# Patient Record
Sex: Female | Born: 1995 | Race: White | Hispanic: No | Marital: Single | Smoking: Former smoker
Health system: Southern US, Community
[De-identification: ages and names within clinical notes are randomized; demographics above are authoritative.]

## PROBLEM LIST (undated history)

## (undated) ENCOUNTER — Inpatient Hospital Stay: Payer: Self-pay

## (undated) DIAGNOSIS — Z789 Other specified health status: Secondary | ICD-10-CM

## (undated) HISTORY — PX: TONSILLECTOMY: SUR1361

---
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
## 2016-07-24 NOTE — L&D Delivery Note (Signed)
Delivery Note At 4:57 AM a viable female was delivered via Vaginal, Spontaneous Delivery (Presentation: LOA).  APGAR: 8, 9; weight pending .   Placenta status: delivered spontaneously, intact.  Cord: 3VC  with the following complications: none.  Cord pH: none  Anesthesia:  epidural Episiotomy: None Lacerations: 2nd degree;Vaginal;Perineal Suture Repair: 3.0 Est. Blood Loss (mL): 300  Mom to postpartum.  Baby to Couplet care / Skin to Skin.  Called to see patient.  Mom pushed to deliver a viable female infant.  The head followed by shoulders, which delivered without difficulty, and the rest of the body.  No nuchal cord noted.  Baby to mom's chest.  Cord clamped and cut after > 1 min delay.  Cord blood obtained.  Placenta delivered spontaneously, intact, with a 3-vessel cord.  Second degree perineal laceration repaired with 3-0 Vicryl in standard fashion.  All counts correct.  Hemostasis obtained with IV pitocin and fundal massage. EBL 300 mL.     Conard NovakJackson, Jamyia Fortune D, MD 08/12/2016, 5:21 AM

## 2016-07-26 ENCOUNTER — Inpatient Hospital Stay
Admission: EM | Admit: 2016-07-26 | Discharge: 2016-07-26 | Disposition: A | Discharge status: Home / Self Care | Payer: Medicaid Other | Attending: Obstetrics & Gynecology | Admitting: Obstetrics & Gynecology

## 2016-07-26 DIAGNOSIS — O471 False labor at or after 37 completed weeks of gestation: Secondary | ICD-10-CM | POA: Diagnosis not present

## 2016-07-26 DIAGNOSIS — O212 Late vomiting of pregnancy: Secondary | ICD-10-CM | POA: Diagnosis not present

## 2016-07-26 DIAGNOSIS — Z3A37 37 weeks gestation of pregnancy: Secondary | ICD-10-CM | POA: Diagnosis not present

## 2016-07-26 DIAGNOSIS — Z87891 Personal history of nicotine dependence: Secondary | ICD-10-CM | POA: Diagnosis not present

## 2016-07-26 NOTE — Discharge Summary (Signed)
Physician Final Progress Note  Patient ID: Kathleen Mccormick MRN: [id number] DOB/AGE: 12-24-1995 21 y.o.  Admit date: 07/26/2016 Admitting provider: Tresea MallJane Robbie Mccormick, CNM Discharge date: 07/26/2016   Admission Diagnoses: G1P0 at 25103w1d with c/o irregular contractions, vomiting x 2 at midnight and decreased fetal movement.  Discharge Diagnoses:  Active Problems: IUP with reactive NST, not in labor   History reviewed. No pertinent past medical history.  History reviewed. No pertinent surgical history.  No current facility-administered medications on file prior to encounter.    Current Outpatient Prescriptions on File Prior to Encounter  Medication Sig Dispense Refill  . ondansetron (ZOFRAN-ODT) 4 MG disintegrating tablet Take 1 tablet (4 mg total) by mouth once as needed for nausea. (Patient not taking: Reported on 07/26/2016) 20 tablet 0    No Known Allergies  Social History   Social History  . Marital status: Single    Spouse name: N/A  . Number of children: N/A  . Years of education: N/A   Occupational History  . Not on file.   Social History Main Topics  . Smoking status: Former Smoker    Packs/day: 0.50    Types: Cigarettes    Quit date: 11/02/2015  . Smokeless tobacco: Never Used  . Alcohol use No  . Drug use: No  . Sexual activity: Yes   Other Topics Concern  . Not on file   Social History Narrative  . No narrative on file    Physical Exam: BP 123/79 (BP Location: Left Arm)   Pulse (!) 110   Temp 98.4 F (36.9 C) (Oral)   Resp 19   Ht 4\' 11"  (1.499 m)   Wt 165 lb (74.8 kg)   LMP 09/12/2015   BMI 33.33 kg/m   Gen: NAD CV: RRR Pulm: CTAB Pelvic: 0/thick/ballotable Ext: no evidence of DVT Toco: occasional Fetal Well Being: 130 bpm, moderate variability, +accelerations, -decelerations  Consults: None  Significant Findings/ Diagnostic Studies: none  Procedures: NST  Discharge Condition: good  Disposition: 01-Home or Self Care  Diet: Regular  diet  Discharge Activity: Activity as tolerated   Allergies as of 07/26/2016   No Known Allergies     Medication List    ASK your doctor about these medications   ondansetron 4 MG disintegrating tablet Commonly known as:  ZOFRAN-ODT Take 1 tablet (4 mg total) by mouth once as needed for nausea.      Follow-up Information    [facility] Follow up on 07/28/2016.   Why:  please keep scheduled OB appointment Contact information: [address] [place] [address] [phone]           Total time spent taking care of this patient: 15 minutes  Signed: Tresea Mccormick,Kathleen Mccormick, CNM  07/26/2016, 6:50 AM

## 2016-07-26 NOTE — Discharge Summary (Signed)
Patient discharged with instructions on labor precautions, follow up appointment, pain medication, and when to seek medical attention. Patient ambulatory at discharge with steady gait. Patient discharged with mother and significant other.

## 2016-07-26 NOTE — OB Triage Note (Signed)
Patient came in for observation for contractions, lower abdominal and back pain, and decreased fetal movement. Patient reports contractions five to ten minutes. Patient states she vomited twice around midnight and has been feeling irregualrcontractions since. Patient states she has been having lower abdominal and back pain since yesterday rating her abdominal pain 6 out of 10 and her back pain 8 out of 10. Patient denies leaking of fluid, vaginal bleeding and spotting. Vital signs stable and patient afebrile. FHR baseline 135 with moderate variability with accelerations 15 x 15 and no decelerations. Family at bedside. Will continue to monitor.

## 2016-08-11 ENCOUNTER — Inpatient Hospital Stay
Admission: EM | Admit: 2016-08-11 | Discharge: 2016-08-13 | DRG: 775 | Disposition: A | Discharge status: Home / Self Care | Payer: Medicaid Other | Attending: Obstetrics and Gynecology | Admitting: Obstetrics and Gynecology

## 2016-08-11 ENCOUNTER — Inpatient Hospital Stay: Payer: Medicaid Other | Admitting: Anesthesiology

## 2016-08-11 DIAGNOSIS — Z87891 Personal history of nicotine dependence: Secondary | ICD-10-CM

## 2016-08-11 DIAGNOSIS — Z3A39 39 weeks gestation of pregnancy: Secondary | ICD-10-CM

## 2016-08-11 DIAGNOSIS — O479 False labor, unspecified: Secondary | ICD-10-CM | POA: Diagnosis present

## 2016-08-11 DIAGNOSIS — Z3493 Encounter for supervision of normal pregnancy, unspecified, third trimester: Secondary | ICD-10-CM | POA: Diagnosis present

## 2016-08-11 HISTORY — DX: Other specified health status: Z78.9

## 2016-08-11 LAB — CHLAMYDIA/NGC RT PCR (ARMC ONLY)
Chlamydia Tr: NOT DETECTED
N GONORRHOEAE: NOT DETECTED

## 2016-08-11 LAB — PROTEIN / CREATININE RATIO, URINE
CREATININE, URINE: 146 mg/dL
PROTEIN CREATININE RATIO: 0.12 mg/mg{creat} (ref 0.00–0.15)
TOTAL PROTEIN, URINE: 18 mg/dL

## 2016-08-11 LAB — COMPREHENSIVE METABOLIC PANEL
ALBUMIN: 2.9 g/dL — AB (ref 3.5–5.0)
ALT: 11 U/L — ABNORMAL LOW (ref 14–54)
ANION GAP: 10 (ref 5–15)
AST: 22 U/L (ref 15–41)
Alkaline Phosphatase: 142 U/L — ABNORMAL HIGH (ref 38–126)
BILIRUBIN TOTAL: 0.6 mg/dL (ref 0.3–1.2)
BUN: 7 mg/dL (ref 6–20)
CHLORIDE: 105 mmol/L (ref 101–111)
CO2: 19 mmol/L — ABNORMAL LOW (ref 22–32)
Calcium: 8.4 mg/dL — ABNORMAL LOW (ref 8.9–10.3)
Creatinine, Ser: 0.48 mg/dL (ref 0.44–1.00)
GFR calc Af Amer: >60 mL/min (ref >60)
GFR calc non Af Amer: >60 mL/min (ref >60)
Glucose, Bld: 85 mg/dL (ref 65–99)
POTASSIUM: 3.7 mmol/L (ref 3.5–5.1)
SODIUM: 134 mmol/L — AB (ref 135–145)
TOTAL PROTEIN: 6.4 g/dL — AB (ref 6.5–8.1)

## 2016-08-11 LAB — CBC
HEMATOCRIT: 30.6 % — AB (ref 35.0–47.0)
HEMOGLOBIN: 9.8 g/dL — AB (ref 12.0–16.0)
MCH: 24.5 pg — AB (ref 26.0–34.0)
MCHC: 32.1 g/dL (ref 32.0–36.0)
MCV: 76.5 fL — AB (ref 80.0–100.0)
Platelets: 329 10*3/uL (ref 150–440)
RBC: 4 MIL/uL (ref 3.80–5.20)
RDW: 15.5 % — ABNORMAL HIGH (ref 11.5–14.5)
WBC: 16.3 10*3/uL — ABNORMAL HIGH (ref 3.6–11.0)

## 2016-08-11 LAB — TYPE AND SCREEN
ABO/RH(D): O POS
Antibody Screen: NEGATIVE

## 2016-08-11 MED ORDER — OXYTOCIN BOLUS FROM INFUSION
500.0000 mL | Freq: Once | INTRAVENOUS | Status: DC
  Administered 2016-08-12: 500 mL via INTRAVENOUS

## 2016-08-11 MED ORDER — LIDOCAINE-EPINEPHRINE (PF) 1.5 %-1:200000 IJ SOLN
INTRAMUSCULAR | Status: DC | PRN
  Administered 2016-08-11: 4 mL via EPIDURAL

## 2016-08-11 MED ORDER — LIDOCAINE HCL (PF) 1 % IJ SOLN
INTRAMUSCULAR | Status: AC
  Filled 2016-08-11: qty 30

## 2016-08-11 MED ORDER — HYDROMORPHONE HCL 1 MG/ML IJ SOLN
1.0000 mg | INTRAMUSCULAR | Status: AC
Start: 2016-08-11 — End: 2016-08-11
  Administered 2016-08-11: 1 mg via INTRAVENOUS
  Filled 2016-08-11: qty 1

## 2016-08-11 MED ORDER — ONDANSETRON HCL 4 MG/2ML IJ SOLN
4.0000 mg | Freq: Four times a day (QID) | INTRAMUSCULAR | Status: DC | PRN
  Filled 2016-08-11 (×2): qty 2

## 2016-08-11 MED ORDER — SOD CITRATE-CITRIC ACID 500-334 MG/5ML PO SOLN
30.0000 mL | ORAL | Status: DC | PRN
  Filled 2016-08-11: qty 15

## 2016-08-11 MED ORDER — OXYTOCIN 40 UNITS IN LACTATED RINGERS INFUSION - SIMPLE MED
2.5000 [IU]/h | INTRAVENOUS | Status: DC
  Filled 2016-08-11: qty 1000

## 2016-08-11 MED ORDER — TERBUTALINE SULFATE 1 MG/ML IJ SOLN: 0.2500 mg | Freq: Once | INTRAMUSCULAR | Status: DC | PRN

## 2016-08-11 MED ORDER — LACTATED RINGERS IV SOLN
INTRAVENOUS | Status: DC
  Administered 2016-08-11 (×2): via INTRAVENOUS
  Administered 2016-08-12: 125 mL/h via INTRAVENOUS

## 2016-08-11 MED ORDER — FENTANYL CITRATE (PF) 100 MCG/2ML IJ SOLN
50.0000 ug | INTRAMUSCULAR | Status: AC
  Administered 2016-08-11: 50 ug via INTRAVENOUS
  Filled 2016-08-11: qty 2

## 2016-08-11 MED ORDER — PHENYLEPHRINE 40 MCG/ML (10ML) SYRINGE FOR IV PUSH (FOR BLOOD PRESSURE SUPPORT): 80.0000 ug | PREFILLED_SYRINGE | INTRAVENOUS | Status: DC | PRN

## 2016-08-11 MED ORDER — BUPIVACAINE HCL (PF) 0.25 % IJ SOLN
INTRAMUSCULAR | Status: DC | PRN
  Administered 2016-08-11: 5 mL via EPIDURAL

## 2016-08-11 MED ORDER — OXYTOCIN 10 UNIT/ML IJ SOLN
INTRAMUSCULAR | Status: AC
  Filled 2016-08-11: qty 2

## 2016-08-11 MED ORDER — DIPHENHYDRAMINE HCL 50 MG/ML IJ SOLN: 12.5000 mg | INTRAMUSCULAR | Status: DC | PRN

## 2016-08-11 MED ORDER — LIDOCAINE HCL (PF) 1 % IJ SOLN
30.0000 mL | INTRAMUSCULAR | Status: AC | PRN
  Administered 2016-08-11: 3 mL via SUBCUTANEOUS

## 2016-08-11 MED ORDER — AMMONIA AROMATIC IN INHA
RESPIRATORY_TRACT | Status: AC
  Filled 2016-08-11: qty 10

## 2016-08-11 MED ORDER — FENTANYL 2.5 MCG/ML W/ROPIVACAINE 0.2% IN NS 100 ML EPIDURAL INFUSION (ARMC-ANES)
10.0000 mL/h | EPIDURAL | Status: DC
  Administered 2016-08-11 – 2016-08-12 (×2): 10 mL/h via EPIDURAL
  Filled 2016-08-11: qty 100

## 2016-08-11 MED ORDER — FENTANYL 2.5 MCG/ML W/ROPIVACAINE 0.2% IN NS 100 ML EPIDURAL INFUSION (ARMC-ANES)
EPIDURAL | Status: AC
  Filled 2016-08-11: qty 100

## 2016-08-11 MED ORDER — SOD CITRATE-CITRIC ACID 500-334 MG/5ML PO SOLN
30.0000 mL | ORAL | Status: DC | PRN
  Administered 2016-08-11: 30 mL via ORAL

## 2016-08-11 MED ORDER — EPHEDRINE 5 MG/ML INJ: 10.0000 mg | INTRAVENOUS | Status: DC | PRN

## 2016-08-11 MED ORDER — OXYTOCIN 10 UNIT/ML IJ SOLN: 10.0000 [IU] | Freq: Once | INTRAMUSCULAR | Status: DC

## 2016-08-11 MED ORDER — MISOPROSTOL 200 MCG PO TABS
ORAL_TABLET | ORAL | Status: AC
  Filled 2016-08-11: qty 4

## 2016-08-11 MED ORDER — OXYTOCIN 40 UNITS IN LACTATED RINGERS INFUSION - SIMPLE MED
1.0000 m[IU]/min | INTRAVENOUS | Status: DC
  Administered 2016-08-11: 1 m[IU]/min via INTRAVENOUS

## 2016-08-11 MED ORDER — LACTATED RINGERS IV SOLN: 500.0000 mL | Freq: Once | INTRAVENOUS | Status: DC

## 2016-08-11 MED ORDER — LACTATED RINGERS IV SOLN
500.0000 mL | INTRAVENOUS | Status: DC | PRN
  Administered 2016-08-11: 500 mL via INTRAVENOUS

## 2016-08-11 MED ORDER — FENTANYL 2.5 MCG/ML W/ROPIVACAINE 0.2% IN NS 100 ML EPIDURAL INFUSION (ARMC-ANES)
EPIDURAL | Status: DC | PRN
  Administered 2016-08-11: 10 mL/h via EPIDURAL

## 2016-08-11 MED ORDER — ONDANSETRON HCL 4 MG/2ML IJ SOLN
4.0000 mg | Freq: Four times a day (QID) | INTRAMUSCULAR | Status: DC | PRN
Start: 2016-08-11 — End: 2016-08-12
  Administered 2016-08-11 – 2016-08-12 (×2): 4 mg via INTRAVENOUS

## 2016-08-11 NOTE — Anesthesia Preprocedure Evaluation (Signed)
Anesthesia Evaluation  Patient identified by MRN, date of birth, ID band Patient awake    Reviewed: Allergy & Precautions, H&P , NPO status , Patient's Chart, lab work & pertinent test results, reviewed documented beta blocker date and time   Airway Mallampati: II  TM Distance: >3 FB Neck ROM: full    Dental no notable dental hx. (+) Teeth Intact   Pulmonary neg pulmonary ROS, Current Smoker, former smoker,    Pulmonary exam normal breath sounds clear to auscultation       Cardiovascular Exercise Tolerance: Good negative cardio ROS   Rhythm:regular Rate:Normal     Neuro/Psych Hx of low back pain negative neurological ROS  negative psych ROS   GI/Hepatic negative GI ROS, Neg liver ROS,   Endo/Other  negative endocrine ROSdiabetes  Renal/GU      Musculoskeletal   Abdominal   Peds  Hematology negative hematology ROS (+)   Anesthesia Other Findings   Reproductive/Obstetrics (+) Pregnancy                             Anesthesia Physical Anesthesia Plan  ASA: II  Anesthesia Plan: Epidural   Post-op Pain Management:    Induction:   Airway Management Planned:   Additional Equipment:   Intra-op Plan:   Post-operative Plan:   Informed Consent: I have reviewed the patients History and Physical, chart, labs and discussed the procedure including the risks, benefits and alternatives for the proposed anesthesia with the patient or authorized representative who has indicated his/her understanding and acceptance.     Plan Discussed with:   Anesthesia Plan Comments:         Anesthesia Quick Evaluation

## 2016-08-11 NOTE — OB Triage Note (Signed)
Pt arrived to OBS rm 3 with c/o of irregular contractions starting at 8pm last night every 15 minutes and through out night contractions have become more regular and increasing with pain and back pain with 8/10 pain rating. No LOF, discharge, or bleeding. Sex 2 nights ago.

## 2016-08-11 NOTE — Anesthesia Procedure Notes (Signed)
Epidural Patient location during procedure: OB  Staffing Performed: anesthesiologist   Preanesthetic Checklist Completed: patient identified, site marked, surgical consent, pre-op evaluation, timeout performed, IV checked, risks and benefits discussed and monitors and equipment checked  Epidural Patient position: sitting Prep: Betadine Patient monitoring: heart rate, continuous pulse ox and blood pressure Approach: midline Location: L4-L5 Injection technique: LOR air  Needle:  Needle type: Tuohy  Needle gauge: 17 G Needle length: 9 cm and 9 Needle insertion depth: 5 cm Catheter type: closed end flexible Catheter size: 19 Gauge Catheter at skin depth: 11 cm Test dose: negative and 1.5% lidocaine with Epi 1:200 K  Assessment Sensory level: T10 Events: blood not aspirated, injection not painful, no injection resistance, negative IV test and no paresthesia  Additional Notes Pt's history reviewed and consent obtained as per OB consent Patient tolerated the insertion well without complications. Negative SATD, negative IVTD All VSS were obtained and monitored through OBIX and nursing protocols followed.Reason for block:procedure for pain

## 2016-08-11 NOTE — Progress Notes (Signed)
Patient has continued to make cervical change. She is now 4cm (from 1cm at presentation). Will admit for active labor. Fetus is category 1.   Thomasene MohairStephen Jackson, MD 08/11/2016 12:33 PM

## 2016-08-11 NOTE — H&P (Signed)
Obstetric H&P   Chief Complaint: Contractions   Prenatal Care Provider: [facility]  History of Present Illness: 21 y.o. G1P0 6264w3d by LMP=7 week US derived EDC of 08/15/2016,  presenting to L&D with contractions.  No LOF, no VB, +FM.   PNC uncomplicated.  PNL A pos / ABSC neg / RI / VZNI / HBsAg neg / RPR NR / HIV neg / 1-hr 80 / GBS negative  Review of Systems: 10 point review of systems negative unless otherwise noted in HPI  Past Medical History: Past Medical History:  Diagnosis Date  . Medical history non-contributory     Past Surgical History: Past Surgical History:  Procedure Laterality Date  . TONSILLECTOMY      Past Obstetric History:  Past Gynecologic History:  Family History: History reviewed. No pertinent family history.  Social History: Social History   Social History  . Marital status: Single    Spouse name: N/A  . Number of children: N/A  . Years of education: N/A   Occupational History  . Not on file.   Social History Main Topics  . Smoking status: Former Smoker    Packs/day: 0.50    Types: Cigarettes    Quit date: 11/02/2015  . Smokeless tobacco: Never Used  . Alcohol use No  . Drug use: No  . Sexual activity: Yes   Other Topics Concern  . Not on file   Social History Narrative  . No narrative on file    Medications: Prior to Admission medications   Medication Sig Start Date End Date Taking? Authorizing Provider  ondansetron (ZOFRAN-ODT) 4 MG disintegrating tablet Take 1 tablet (4 mg total) by mouth once as needed for nausea. Patient not taking: Reported on 07/26/2016 09/26/15   Dan HumphreysMichael Irick, MD    Allergies: No Known Allergies  Physical Exam: Vitals: Blood pressure 139/86, pulse 76, temperature 98.8 F (37.1 C), temperature source Oral, last menstrual period 09/12/2015.  Urine Dip Protein: N/A  FHT:120, moderate, +accels, no decels Toco: q2-463min  General: NAD HEENT: normocephalic, anicteric Pulmonary: no increased work of  breathing Cardiovascular: regular rate Abdomen: Gravid, non-tender Leopolds: vtx Genitourinary:  Dilation: 1 Effacement (%): 80 Cervical Position: Middle Station: -3 Presentation: Vertex Exam by:: JCM  Extremities: no edema Neuro: grossly intact Pscyh: mood appropriate, affect full  Labs: No results found for this or any previous visit (from the past 24 hour(s)).  Assessment: 21 y.o. G1P0 4364w3d by 08/15/2016, with early labor vs Braxton-Hicks contractions  Plan: 1) R/O labor - will allow patient to ambulate, recheck in 1-2hrs to see if cervical change  2) Fetus - cat I tracing  3) PNL - PNL A pos / ABSC neg / RI / VZNI / HBsAg neg / RPR NR / HIV neg / 1-hr 80 / GBS negative  4) TDAP - verified went to [facility] to receive  5) Disposition - pending recheck

## 2016-08-12 LAB — RPR: RPR: NONREACTIVE

## 2016-08-12 MED ORDER — SENNOSIDES-DOCUSATE SODIUM 8.6-50 MG PO TABS
2.0000 | ORAL_TABLET | ORAL | Status: DC
  Administered 2016-08-13: 2 via ORAL
  Filled 2016-08-12: qty 2

## 2016-08-12 MED ORDER — IBUPROFEN 600 MG PO TABS
ORAL_TABLET | ORAL | Status: AC
  Administered 2016-08-12: 600 mg via ORAL
  Filled 2016-08-12: qty 1

## 2016-08-12 MED ORDER — HYDROCODONE-ACETAMINOPHEN 5-325 MG PO TABS: 1.0000 | ORAL_TABLET | Freq: Four times a day (QID) | ORAL | Status: DC | PRN

## 2016-08-12 MED ORDER — PRENATAL MULTIVITAMIN CH
1.0000 | ORAL_TABLET | Freq: Every day | ORAL | Status: DC
  Administered 2016-08-12 – 2016-08-13 (×2): 1 via ORAL
  Filled 2016-08-12 (×2): qty 1

## 2016-08-12 MED ORDER — ONDANSETRON HCL 4 MG PO TABS: 4.0000 mg | ORAL_TABLET | ORAL | Status: DC | PRN

## 2016-08-12 MED ORDER — ACETAMINOPHEN 325 MG PO TABS: 650.0000 mg | ORAL_TABLET | Freq: Once | ORAL | Status: DC

## 2016-08-12 MED ORDER — ONDANSETRON HCL 4 MG/2ML IJ SOLN: 4.0000 mg | INTRAMUSCULAR | Status: DC | PRN

## 2016-08-12 MED ORDER — SIMETHICONE 80 MG PO CHEW: 80.0000 mg | CHEWABLE_TABLET | ORAL | Status: DC | PRN

## 2016-08-12 MED ORDER — WITCH HAZEL-GLYCERIN EX PADS: 1.0000 "application " | MEDICATED_PAD | CUTANEOUS | Status: DC | PRN

## 2016-08-12 MED ORDER — DIBUCAINE 1 % RE OINT: 1.0000 "application " | TOPICAL_OINTMENT | RECTAL | Status: DC | PRN

## 2016-08-12 MED ORDER — COCONUT OIL OIL
1.0000 "application " | TOPICAL_OIL | Status: DC | PRN
  Filled 2016-08-12: qty 120

## 2016-08-12 MED ORDER — DIPHENHYDRAMINE HCL 25 MG PO CAPS: 25.0000 mg | ORAL_CAPSULE | Freq: Four times a day (QID) | ORAL | Status: DC | PRN

## 2016-08-12 MED ORDER — IBUPROFEN 600 MG PO TABS
600.0000 mg | ORAL_TABLET | Freq: Four times a day (QID) | ORAL | Status: DC
  Administered 2016-08-12 – 2016-08-13 (×5): 600 mg via ORAL
  Filled 2016-08-12 (×6): qty 1

## 2016-08-12 MED ORDER — FAMOTIDINE 20 MG PO TABS
ORAL_TABLET | ORAL | Status: AC
  Administered 2016-08-12: 20 mg via ORAL
  Filled 2016-08-12: qty 1

## 2016-08-12 MED ORDER — BENZOCAINE-MENTHOL 20-0.5 % EX AERO
1.0000 "application " | INHALATION_SPRAY | CUTANEOUS | Status: DC | PRN
  Filled 2016-08-12: qty 56

## 2016-08-12 MED ORDER — ACETAMINOPHEN 325 MG PO TABS
ORAL_TABLET | ORAL | Status: AC
  Administered 2016-08-12: 650 mg via ORAL
  Filled 2016-08-12: qty 2

## 2016-08-12 MED ORDER — HYDROCODONE-ACETAMINOPHEN 5-325 MG PO TABS
2.0000 | ORAL_TABLET | Freq: Four times a day (QID) | ORAL | Status: DC | PRN
  Administered 2016-08-12: 2 via ORAL
  Filled 2016-08-12: qty 2

## 2016-08-12 MED ORDER — ACETAMINOPHEN 325 MG PO TABS
650.0000 mg | ORAL_TABLET | ORAL | Status: DC | PRN
  Administered 2016-08-12: 650 mg via ORAL
  Filled 2016-08-12: qty 2

## 2016-08-12 MED ORDER — VARICELLA VIRUS VACCINE LIVE 1350 PFU/0.5ML IJ SUSR
0.5000 mL | INTRAMUSCULAR | Status: DC | PRN
Start: 2016-08-12 — End: 2016-08-13

## 2016-08-12 MED ORDER — FERROUS SULFATE 325 (65 FE) MG PO TABS
325.0000 mg | ORAL_TABLET | Freq: Two times a day (BID) | ORAL | Status: DC
  Administered 2016-08-12 – 2016-08-13 (×3): 325 mg via ORAL
  Filled 2016-08-12 (×3): qty 1

## 2016-08-12 NOTE — Progress Notes (Addendum)
Patient ID: Kathleen Mccormick, female   DOB: 1996-01-22, 21 y.o.   MRN: [id number]  Labor Check  Subj:  Complaints: feeling pressure, but overall comfortable with epidural   Obj:  BP 120/84   Pulse 88   Temp 98.3 F (36.8 C) (Oral)   Resp 16   Ht 4\' 11"  (1.499 m)   Wt 165 lb (74.8 kg)   LMP 09/12/2015   BMI 33.33 kg/m  Dose (milli-units/min) Oxytocin: 3 milli-units/min  Cervix: complete, 0 to +1 (female chaperone present)  AROM: clear fluid Baseline FHR: 125    Variability: moderate    Accelerations: present    Decelerations: absent Contractions: present frequency: 3-4 q 10 min  A/P: 21 y.o. G1P0 female at 2576w4d with active labor.  1.  Labor: continue pitocin augmentation. Rate is 3 mU/min currently. Labor down for now.  Expect vaginal delivery. AROM performed with clear fluid returned.  2.  FWB: reassuring, Overall assessment: category 1  3.  GBS negative  4.  Pain: epidural 5.  Recheck: prn.     Thomasene MohairStephen Lovetta Condie, MD 08/12/2016 2:19 AM

## 2016-08-12 NOTE — Discharge Summary (Signed)
OB Discharge Summary     Patient Name: Kathleen Mccormick DOB: 1995-09-02 MRN: [id number]  Date of admission: 08/11/2016 Delivering MD: Conard NovakJackson, Stephen D, MD  Date of Delivery: 08/12/2016  Date of discharge: 08/13/2016  Admitting diagnosis: 39 Weeks Contractions Intrauterine pregnancy: 7124w4d     Secondary diagnosis: None     Discharge diagnosis: Term Pregnancy Delivered                                                                                                Post partum procedures:Routine. Breast feeding.  Plans Nexplanon.  Augmentation: Pitocin  Complications: None  Hospital course:  Onset of Labor With Vaginal Delivery     21 y.o. yo G1P0 at 6724w4d was admitted in Active Labor on 08/11/2016. Patient had an uncomplicated labor course as follows:  Membrane Rupture Time/Date: 2:17 AM ,08/12/2016   Intrapartum Procedures: Episiotomy: None [1]                                         Lacerations:  2nd degree [3];Vaginal [6];Perineal [11]  Patient had a delivery of a Viable infant. 08/12/2016  Information for the patient's newborn:  Andrey CampanileWilson, Girl DufurMadison [[id number]][[id number]]  Delivery Method: Vag-Spont    Pateint had an uncomplicated postpartum course.  She is ambulating, tolerating a regular diet, passing flatus, and urinating well. Patient is discharged home in stable condition on 08/13/16.   Physical exam  Vitals:   08/12/16 1940 08/13/16 0013 08/13/16 0347 08/13/16 0818  BP: 116/74 (!) 113/58 131/63 125/83  Pulse: 70 80 64 68  Resp: 16 18 20 18   Temp: 97.3 F (36.3 C) 98 F (36.7 C) 98 F (36.7 C) 98.1 F (36.7 C)  TempSrc: Oral Oral Oral Oral  SpO2:      Weight:      Height:       General: alert, cooperative and no distress Lochia: appropriate Uterine Fundus: firm Incision: N/A DVT Evaluation: No evidence of DVT seen on physical exam. No cords or calf tenderness. No significant calf/ankle edema.  Labs: Lab Results  Component Value Date   WBC 17.9 (H) 08/13/2016   HGB 8.2 (L) 08/13/2016   HCT 25.3 (L) 08/13/2016   MCV 77.4 (L) 08/13/2016   PLT 257 08/13/2016   CMP Latest Ref Rng & Units 08/11/2016  Glucose 65 - 99 mg/dL 85  BUN 6 - 20 mg/dL 7  Creatinine 7.820.44 - 9.561.00 mg/dL 2.130.48  Sodium 086135 - 578145 mmol/L 134(L)  Potassium 3.5 - 5.1 mmol/L 3.7  Chloride 101 - 111 mmol/L 105  CO2 22 - 32 mmol/L 19(L)  Calcium 8.9 - 10.3 mg/dL 4.6(N8.4(L)  Total Protein 6.5 - 8.1 g/dL 6.4(L)  Total Bilirubin 0.3 - 1.2 mg/dL 0.6  Alkaline Phos 38 - 126 U/L 142(H)  AST 15 - 41 U/L 22  ALT 14 - 54 U/L 11(L)    Discharge instruction: per After Visit Summary.  Medications:  Allergies as of 08/13/2016   No Known Allergies     Medication  List    STOP taking these medications   ondansetron 4 MG disintegrating tablet Commonly known as:  ZOFRAN-ODT     TAKE these medications   ferrous sulfate 325 (65 FE) MG tablet Take 1 tablet (325 mg total) by mouth 2 (two) times daily with a meal.       Diet: routine diet  Activity: Advance as tolerated. Pelvic rest for 6 weeks.   Outpatient follow up: Follow-up Information    Conard Novak, MD Follow up in 6 week(s).   Specialty:  Obstetrics and Gynecology Contact information: [address] [place] [address] [phone]             Postpartum contraception: Nexplanon Rhogam Given postpartum: no Rubella vaccine given postpartum: no Varicella vaccine given postpartum: yes TDaP given antepartum or postpartum: yes  Newborn Data: Live born FEMALE APGAR: 8, 9   Baby Feeding: Breast  Disposition:home with mother  SIGNED: Annamarie Major, MD

## 2016-08-13 LAB — CBC
HCT: 25.3 % — ABNORMAL LOW (ref 35.0–47.0)
Hemoglobin: 8.2 g/dL — ABNORMAL LOW (ref 12.0–16.0)
MCH: 25.2 pg — ABNORMAL LOW (ref 26.0–34.0)
MCHC: 32.5 g/dL (ref 32.0–36.0)
MCV: 77.4 fL — AB (ref 80.0–100.0)
PLATELETS: 257 10*3/uL (ref 150–440)
RBC: 3.27 MIL/uL — ABNORMAL LOW (ref 3.80–5.20)
RDW: 15.7 % — ABNORMAL HIGH (ref 11.5–14.5)
WBC: 17.9 10*3/uL — AB (ref 3.6–11.0)

## 2016-08-13 MED ORDER — VARICELLA VIRUS VACCINE LIVE 1350 PFU/0.5ML IJ SUSR
0.5000 mL | Freq: Once | INTRAMUSCULAR | Status: AC
  Administered 2016-08-13: 0.5 mL via SUBCUTANEOUS
  Filled 2016-08-13: qty 0.5

## 2016-08-13 MED ORDER — FERROUS SULFATE 325 (65 FE) MG PO TABS: 325.0000 mg | ORAL_TABLET | Freq: Two times a day (BID) | ORAL | 1 refills | Status: DC

## 2016-08-13 NOTE — Discharge Instructions (Signed)
Follow up sooner with fever, problems breathing, pain not helped by medications, severe depression( more than just baby blues, wanting to hurt yourself or the baby), severe bleeding ( saturating more than one pad an hour or large palm sized clots), no heavy lifting , no driving while taking narcotics, no douches, intercourse, tampons or enemas for 6 weeks  °

## 2016-08-13 NOTE — Progress Notes (Signed)
Admit Date: 08/11/2016 Today's Date: 08/13/2016  Post Partum Day 1  Subjective:  no complaints  Objective: Temp:  [97.3 F (36.3 C)-98.8 F (37.1 C)] 98.1 F (36.7 C) (01/21 0818) Pulse Rate:  [64-80] 68 (01/21 0818) Resp:  [14-20] 18 (01/21 0818) BP: (113-132)/(58-83) 125/83 (01/21 0818) SpO2:  [98 %-99 %] 98 % (01/20 1601)  Physical Exam:  General: alert, cooperative and no distress Lochia: appropriate Uterine Fundus: firm Incision: none DVT Evaluation: No evidence of DVT seen on physical exam.   Recent Labs  08/11/16 1026 08/13/16 0541  HGB 9.8* 8.2*  HCT 30.6* 25.3*    Assessment/Plan: Discharge home, Breastfeeding and Infant doing well   LOS: 2 days   Letitia Libraobert Paul Dirck Butch [facility] 08/13/2016, 9:18 AM

## 2016-08-13 NOTE — Progress Notes (Signed)
All discharge instructions given to Mom and she voiced understanding of all instructions given. She will make her own appt. Prescription given . Patient discharged home with infant and escorted out by RN in wheelchair..Marland Kitchen

## 2016-08-15 NOTE — Anesthesia Postprocedure Evaluation (Signed)
Anesthesia Post Note  Patient: Kathleen Mccormick  Procedure(s) Performed: * No procedures listed *  Patient location during evaluation: [place] Anesthesia Type: Epidural Level of consciousness: awake and alert Pain management: pain level controlled Vital Signs Assessment: post-procedure vital signs reviewed and stable Respiratory status: spontaneous breathing, nonlabored ventilation and respiratory function stable Cardiovascular status: stable Postop Assessment: no headache, no backache and epidural receding Anesthetic complications: no     Last Vitals: There were no vitals filed for this visit.  Last Pain: There were no vitals filed for this visit.               Yevette EdwardsJames G Marque Bango

## 2016-11-15 ENCOUNTER — Ambulatory Visit (INDEPENDENT_AMBULATORY_CARE_PROVIDER_SITE_OTHER): Payer: Medicaid Other | Admitting: Obstetrics and Gynecology

## 2016-11-15 ENCOUNTER — Encounter: Payer: Self-pay | Admitting: Obstetrics and Gynecology

## 2016-11-15 VITALS — Ht 59.0 in | Wt 144.0 lb

## 2016-11-15 DIAGNOSIS — Z0001 Encounter for general adult medical examination with abnormal findings: Secondary | ICD-10-CM

## 2016-11-15 DIAGNOSIS — Z30011 Encounter for initial prescription of contraceptive pills: Secondary | ICD-10-CM

## 2016-11-15 DIAGNOSIS — Z01419 Encounter for gynecological examination (general) (routine) without abnormal findings: Secondary | ICD-10-CM | POA: Diagnosis not present

## 2016-11-15 DIAGNOSIS — R300 Dysuria: Secondary | ICD-10-CM

## 2016-11-15 DIAGNOSIS — Z113 Encounter for screening for infections with a predominantly sexual mode of transmission: Secondary | ICD-10-CM

## 2016-11-15 LAB — POCT URINALYSIS DIPSTICK
BILIRUBIN UA: NEGATIVE
Blood, UA: NEGATIVE
Glucose, UA: NEGATIVE
Ketones, UA: NEGATIVE
LEUKOCYTES UA: NEGATIVE
Nitrite, UA: NEGATIVE
PH UA: 6 (ref 5.0–8.0)
PROTEIN UA: NEGATIVE
Spec Grav, UA: 1.02 (ref 1.010–1.025)
UROBILINOGEN UA: NEGATIVE U/dL — AB

## 2016-11-15 MED ORDER — NORGESTIMATE-ETH ESTRADIOL 0.25-35 MG-MCG PO TABS: 1.0000 | ORAL_TABLET | Freq: Every day | ORAL | 4 refills | Status: AC

## 2016-11-15 NOTE — Progress Notes (Signed)
PCP: No PCP Per Patient  Chief Complaint  Patient presents with  . Annual Exam    History of Present Illness:  [name] is a 21 y.o. G1P0 who LMP was Patient's last menstrual period was 11/05/2016., presents today for her annual examination.  Her menses are regular every 28-30 days, lasting 5 day(s).  Dysmenorrhea none. She does not have intermenstrual bleeding.  She is single partner, contraception - none, but would like to start the combined oral contraceptive pill.  Last Pap: n/a given age Hx of STDs: none  Last mammogram: n/a given age There is no FH of breast cancer. There is no FH of ovarian cancer.   Tobacco use: denies current use. Alcohol use: none Exercise: not active  The patient wears seatbelts: yes.     Contraception: Patient is a 21 y.o. G1P0 presenting for contraception consult.  She is currently on none and desiring to start OCP (estrogen/progesterone).  She has a past medical history significant for no contraindication to estrogen.  She specifically denies a history of migraine with aura, chronic hypertension, history of DVT/PE and smoking.  Reported Patient's last menstrual period was 11/05/2016.Marland Kitchen     Urinary Tract Infection: Patient complains of burning with urination She has had symptoms for 2 weeks. Patient also complains of no other symptoms. Patient denies back pain, congestion, cough, fever, headache, rhinitis, sorethroat, stomach ache and vaginal discharge. Patient does not have a history of recurrent UTI.  Patient does not have a history of pyelonephritis.    Review of Systems: Review of Systems  Constitutional: Negative.   HENT: Negative.   Eyes: Negative.   Respiratory: Negative.   Cardiovascular: Negative.   Gastrointestinal: Negative.   Genitourinary: Positive for dysuria. Negative for flank pain, frequency, hematuria and urgency.  Musculoskeletal: Negative.   Skin: Negative.   Neurological: Negative.   Psychiatric/Behavioral:  Negative.     Past Medical History:  Diagnosis Date  . Medical history non-contributory     Past Surgical History:  Procedure Laterality Date  . TONSILLECTOMY      Medications:   Medication Sig Start Date End Date Taking? Authorizing Provider  ferrous sulfate 325 (65 FE) MG tablet Take 1 tablet (325 mg total) by mouth 2 (two) times daily with a meal. Patient not taking: Reported on 11/15/2016 08/13/16   Nadara Mustard, MD    Allergies: No Known Allergies  Gynecologic History: Patient's last menstrual period was 11/05/2016. History of abnormal pap smear: No History of STI: No   Obstetric History: G1P1  Social History   Social History  . Marital status: Single    Spouse name: N/A  . Number of children: N/A  . Years of education: N/A   Occupational History  . Not on file.   Social History Main Topics  . Smoking status: Former Smoker    Packs/day: 0.50    Types: Cigarettes    Quit date: 11/02/2015  . Smokeless tobacco: Never Used  . Alcohol use No  . Drug use: No  . Sexual activity: Yes    Birth control/ protection: None   Other Topics Concern  . Not on file   Social History Narrative  . No narrative on file    Family History: Denies history of gynecologic cancer  Physical Exam Vitals: There were no vitals filed for this visit. Physical Exam  Constitutional: She is oriented to person, place, and time. She appears well-developed and well-nourished. No distress.  HENT:  Head: Normocephalic  and atraumatic.  Eyes: Conjunctivae are normal. No scleral icterus.  Neck: Normal range of motion. Neck supple. No thyromegaly present.  Cardiovascular: Normal rate and regular rhythm.  Exam reveals no gallop and no friction rub.   No murmur heard. Pulmonary/Chest: Effort normal and breath sounds normal. No respiratory distress. She has no wheezes. She has no rales.  Abdominal: Soft. Bowel sounds are normal. She exhibits no distension and no mass. There is no  tenderness. There is no rebound and no guarding. No hernia.  Musculoskeletal: Normal range of motion. She exhibits no edema.  Lymphadenopathy:    She has no cervical adenopathy.  Neurological: She is alert and oriented to person, place, and time.  Skin: Skin is warm. No rash noted. No erythema.  Psychiatric: She has a normal mood and affect. Her behavior is normal. Judgment normal.     Female chaperone present for pelvic and breast  portions of the physical exam  Urine pregnancy test - negative  Assessment: 21 y.o. G1P1 here for annual gynecologic exam. She also desires contraception. She also has dysuria.   Plan:  1) Contraception - Reviewed all forms of birth control options available including abstinence; over the counter/barrier methods; hormonal contraceptive medication including pill, patch, ring, injection,contraceptive implant; hormonal and nonhormonal IUDs; permanent sterilization options including vasectomy and the various tubal sterilization modalities. Risks and benefits reviewed.  Questions were answered.  She elects to use   2) STI screening was offered: done   3) Pap: not done due to age, per ASCCP guidelines  4) Follow up 1 year for routine annual exam  Thomasene Mohair, MD 11/15/2016 1:54 PM

## 2016-11-17 LAB — GC/CHLAMYDIA PROBE AMP
Chlamydia trachomatis, NAA: NEGATIVE
Neisseria gonorrhoeae by PCR: NEGATIVE

## 2020-07-28 DIAGNOSIS — F419 Anxiety disorder, unspecified: Secondary | ICD-10-CM | POA: Insufficient documentation

## 2020-07-28 DIAGNOSIS — F330 Major depressive disorder, recurrent, mild: Secondary | ICD-10-CM | POA: Insufficient documentation

## 2021-08-10 ENCOUNTER — Ambulatory Visit
Admission: EM | Admit: 2021-08-10 | Discharge: 2021-08-10 | Disposition: A | Discharge status: Home / Self Care | Payer: Managed Care, Other (non HMO)

## 2021-08-10 ENCOUNTER — Other Ambulatory Visit: Payer: Self-pay

## 2021-08-10 ENCOUNTER — Encounter: Payer: Self-pay | Admitting: Medical Oncology

## 2021-08-10 DIAGNOSIS — R52 Pain, unspecified: Secondary | ICD-10-CM

## 2021-08-10 DIAGNOSIS — J029 Acute pharyngitis, unspecified: Secondary | ICD-10-CM | POA: Diagnosis not present

## 2021-08-10 DIAGNOSIS — R509 Fever, unspecified: Secondary | ICD-10-CM

## 2021-08-10 DIAGNOSIS — R519 Headache, unspecified: Secondary | ICD-10-CM

## 2021-08-10 LAB — POCT RAPID STREP A (OFFICE): Rapid Strep A Screen: NEGATIVE

## 2021-08-10 NOTE — ED Provider Notes (Signed)
Renaldo Fiddler    CSN: [id number] Arrival date & time: 08/10/21  8242      History   Chief Complaint Sore Throat  HPI Kathleen Mccormick is a 26 y.o. female.   HPI  Sore Throat: Patient reports that they have had symptoms of sore throat, headache, low grade fevers for the past 1 days. Symptoms are stable. They deny SOB, chest pain, or vomiting. They have tried tylenol for symptoms. She presents with her daughter who is sick with a runny nose and cough.   Past Medical History:  Diagnosis Date   Medical history non-contributory     There are no problems to display for this patient.   Past Surgical History:  Procedure Laterality Date   TONSILLECTOMY      OB History     Gravida  1   Para      Term      Preterm      AB      Living  1      SAB      IAB      Ectopic      Multiple      Live Births  1            Home Medications    Prior to Admission medications   Medication Sig Start Date End Date Taking? Authorizing Provider  Liraglutide -Weight Management (SAXENDA) 18 MG/3ML SOPN Inject into the skin. 07/14/21 08/11/21 Yes [provider]  escitalopram (LEXAPRO) 5 MG tablet Take by mouth. 08/09/21   [provider]  magnesium oxide (MAG-OX) 400 (240 Mg) MG tablet Take 1 tablet by mouth daily. 05/31/21   [provider]  norgestimate-ethinyl estradiol (SPRINTEC 28) 0.25-35 MG-MCG tablet Take 1 tablet by mouth daily. 11/15/16 02/07/17  Conard Novak, MD    Family History History reviewed. No pertinent family history.  Social History Social History   Tobacco Use   Smoking status: Former    Packs/day: 0.50    Types: Cigarettes    Quit date: 11/02/2015    Years since quitting: 5.7   Smokeless tobacco: Never  Substance Use Topics   Alcohol use: No   Drug use: No     Allergies   Patient has no known allergies.   Review of Systems Review of Systems  As stated above in HPI Physical Exam Triage Vital  Signs ED Triage Vitals [08/10/21 0910]  Enc Vitals Group     BP 121/80     Pulse Rate (!) 103     Resp 18     Temp 99.1 F (37.3 C)     Temp Source Oral     SpO2 96 %     Weight      Height      Head Circumference      Peak Flow      Pain Score      Pain Loc      Pain Edu?      Excl. in GC?    No data found.  Updated Vital Signs BP 121/80 (BP Location: Left Arm)    Pulse (!) 103    Temp 99.1 F (37.3 C) (Oral)    Resp 18    LMP 05/27/2021    SpO2 96%   Physical Exam Vitals and nursing note reviewed.  Constitutional:      General: She is not in acute distress.    Appearance: Normal appearance. She is not ill-appearing, toxic-appearing or diaphoretic.  HENT:     Head: Normocephalic and atraumatic.     Right Ear: Tympanic membrane normal.     Left Ear: Tympanic membrane normal.     Nose: Congestion and rhinorrhea present.     Mouth/Throat:     Mouth: Mucous membranes are moist.     Pharynx: Oropharyngeal exudate present. No posterior oropharyngeal erythema.  Eyes:     General:        Right eye: No discharge.        Left eye: No discharge.     Extraocular Movements: Extraocular movements intact.     Conjunctiva/sclera: Conjunctivae normal.     Pupils: Pupils are equal, round, and reactive to light.  Cardiovascular:     Rate and Rhythm: Normal rate and regular rhythm.     Heart sounds: Normal heart sounds.  Pulmonary:     Effort: Pulmonary effort is normal. No respiratory distress.     Breath sounds: Normal breath sounds. No stridor. No wheezing or rhonchi.  Abdominal:     Palpations: Abdomen is soft.  Musculoskeletal:     Cervical back: Normal range of motion and neck supple.  Lymphadenopathy:     Cervical: No cervical adenopathy.  Skin:    General: Skin is warm.     Coloration: Skin is not jaundiced.     Findings: No erythema or rash.  Neurological:     Mental Status: She is alert and oriented to person, place, and time.     UC Treatments / Results   Labs (all labs ordered are listed, but only abnormal results are displayed) Labs Reviewed  NOVEL CORONAVIRUS, NAA  POCT RAPID STREP A (OFFICE)    EKG   Radiology No results found.  Procedures Procedures (including critical care time)  Medications Ordered in UC Medications - No data to display  Initial Impression / Assessment and Plan / UC Course  I have reviewed the triage vital signs and the nursing notes.  Pertinent labs & imaging results that were available during my care of the patient were reviewed by me and considered in my medical decision making (see chart for details).     New. Appears viral and strep testing is negative in office. COVID-19 testing pending. For now rest, hydration with water, tylenol or motrin. Follow up PRN.  Final Clinical Impressions(s) / UC Diagnoses   Final diagnoses:  Sore throat  Fever, unspecified  Body aches  Acute intractable headache, unspecified headache type   Discharge Instructions   None    ED Prescriptions   None    PDMP not reviewed this encounter.   Rushie Chestnut, [place] 08/10/21 [id number]

## 2021-08-10 NOTE — ED Triage Notes (Addendum)
Pt presents with ST, bodyaches, fever started yesterday. Covid and flu testing done today she is awaiting results.

## 2021-08-11 LAB — SARS-COV-2, NAA 2 DAY TAT

## 2021-08-11 LAB — NOVEL CORONAVIRUS, NAA: SARS-CoV-2, NAA: NOT DETECTED

## 2021-08-25 ENCOUNTER — Other Ambulatory Visit: Payer: Self-pay | Admitting: Family Medicine

## 2021-08-25 ENCOUNTER — Ambulatory Visit
Admission: RE | Admit: 2021-08-25 | Discharge: 2021-08-25 | Disposition: A | Discharge status: Home / Self Care | Payer: Managed Care, Other (non HMO) | Source: Ambulatory Visit | Attending: Family Medicine | Admitting: Family Medicine

## 2021-08-25 DIAGNOSIS — R1031 Right lower quadrant pain: Secondary | ICD-10-CM | POA: Diagnosis present

## 2021-08-25 DIAGNOSIS — R1011 Right upper quadrant pain: Secondary | ICD-10-CM | POA: Insufficient documentation

## 2021-08-25 MED ORDER — IOHEXOL 300 MG/ML  SOLN
100.0000 mL | Freq: Once | INTRAMUSCULAR | Status: AC | PRN
  Administered 2021-08-25: 100 mL via INTRAVENOUS

## 2021-08-26 ENCOUNTER — Other Ambulatory Visit: Payer: Self-pay | Admitting: Family Medicine

## 2021-08-26 DIAGNOSIS — R1031 Right lower quadrant pain: Secondary | ICD-10-CM

## 2021-08-26 DIAGNOSIS — R1032 Left lower quadrant pain: Secondary | ICD-10-CM

## 2021-08-26 DIAGNOSIS — N939 Abnormal uterine and vaginal bleeding, unspecified: Secondary | ICD-10-CM

## 2021-08-31 ENCOUNTER — Ambulatory Visit
Admission: RE | Admit: 2021-08-31 | Discharge: 2021-08-31 | Disposition: A | Discharge status: Home / Self Care | Payer: Managed Care, Other (non HMO) | Source: Ambulatory Visit | Attending: Family Medicine | Admitting: Family Medicine

## 2021-08-31 ENCOUNTER — Other Ambulatory Visit: Payer: Self-pay

## 2021-08-31 DIAGNOSIS — N939 Abnormal uterine and vaginal bleeding, unspecified: Secondary | ICD-10-CM | POA: Diagnosis not present

## 2021-08-31 DIAGNOSIS — R1032 Left lower quadrant pain: Secondary | ICD-10-CM | POA: Diagnosis present

## 2021-08-31 DIAGNOSIS — R1031 Right lower quadrant pain: Secondary | ICD-10-CM | POA: Diagnosis present

## 2021-09-30 ENCOUNTER — Encounter: Payer: Self-pay | Admitting: Emergency Medicine

## 2021-09-30 ENCOUNTER — Other Ambulatory Visit: Payer: Self-pay

## 2021-09-30 ENCOUNTER — Emergency Department
Admission: EM | Admit: 2021-09-30 | Discharge: 2021-09-30 | Disposition: A | Discharge status: Home / Self Care | Payer: Managed Care, Other (non HMO) | Attending: Emergency Medicine | Admitting: Emergency Medicine

## 2021-09-30 ENCOUNTER — Emergency Department: Payer: Managed Care, Other (non HMO)

## 2021-09-30 DIAGNOSIS — N858 Other specified noninflammatory disorders of uterus: Secondary | ICD-10-CM | POA: Insufficient documentation

## 2021-09-30 DIAGNOSIS — R102 Pelvic and perineal pain: Secondary | ICD-10-CM

## 2021-09-30 DIAGNOSIS — R110 Nausea: Secondary | ICD-10-CM | POA: Insufficient documentation

## 2021-09-30 DIAGNOSIS — N939 Abnormal uterine and vaginal bleeding, unspecified: Secondary | ICD-10-CM | POA: Diagnosis not present

## 2021-09-30 DIAGNOSIS — D72829 Elevated white blood cell count, unspecified: Secondary | ICD-10-CM | POA: Insufficient documentation

## 2021-09-30 LAB — URINALYSIS, COMPLETE (UACMP) WITH MICROSCOPIC
Bacteria, UA: NONE SEEN
Bilirubin Urine: NEGATIVE
Glucose, UA: NEGATIVE mg/dL
Ketones, ur: NEGATIVE mg/dL
Leukocytes,Ua: NEGATIVE
Nitrite: NEGATIVE
Protein, ur: NEGATIVE mg/dL
RBC / HPF: >50 RBC/hpf — ABNORMAL HIGH (ref 0–5)
Specific Gravity, Urine: 1.018 (ref 1.005–1.030)
pH: 6 (ref 5.0–8.0)

## 2021-09-30 LAB — CBC WITH DIFFERENTIAL/PLATELET
Abs Immature Granulocytes: 0.04 10*3/uL (ref 0.00–0.07)
Basophils Absolute: 0.1 10*3/uL (ref 0.0–0.1)
Basophils Relative: 1 %
Eosinophils Absolute: 0 10*3/uL (ref 0.0–0.5)
Eosinophils Relative: 0 %
HCT: 43.2 % (ref 36.0–46.0)
Hemoglobin: 13.8 g/dL (ref 12.0–15.0)
Immature Granulocytes: 0 %
Lymphocytes Relative: 9 %
Lymphs Abs: 1.3 10*3/uL (ref 0.7–4.0)
MCH: 28.8 pg (ref 26.0–34.0)
MCHC: 31.9 g/dL (ref 30.0–36.0)
MCV: 90.2 fL (ref 80.0–100.0)
Monocytes Absolute: 0.9 10*3/uL (ref 0.1–1.0)
Monocytes Relative: 6 %
Neutro Abs: 12.1 10*3/uL — ABNORMAL HIGH (ref 1.7–7.7)
Neutrophils Relative %: 84 %
Platelets: 406 10*3/uL — ABNORMAL HIGH (ref 150–400)
RBC: 4.79 MIL/uL (ref 3.87–5.11)
RDW: 11.9 % (ref 11.5–15.5)
WBC: 14.5 10*3/uL — ABNORMAL HIGH (ref 4.0–10.5)
nRBC: 0 % (ref 0.0–0.2)

## 2021-09-30 LAB — COMPREHENSIVE METABOLIC PANEL
ALT: 12 U/L (ref 0–44)
AST: 20 U/L (ref 15–41)
Albumin: 4.2 g/dL (ref 3.5–5.0)
Alkaline Phosphatase: 61 U/L (ref 38–126)
Anion gap: 7 (ref 5–15)
BUN: 7 mg/dL (ref 6–20)
CO2: 27 mmol/L (ref 22–32)
Calcium: 9 mg/dL (ref 8.9–10.3)
Chloride: 104 mmol/L (ref 98–111)
Creatinine, Ser: 0.66 mg/dL (ref 0.44–1.00)
GFR, Estimated: >60 mL/min (ref >60)
Glucose, Bld: 92 mg/dL (ref 70–99)
Potassium: 4.5 mmol/L (ref 3.5–5.1)
Sodium: 138 mmol/L (ref 135–145)
Total Bilirubin: 1.3 mg/dL — ABNORMAL HIGH (ref 0.3–1.2)
Total Protein: 7.6 g/dL (ref 6.5–8.1)

## 2021-09-30 LAB — LIPASE, BLOOD: Lipase: 27 U/L (ref 11–51)

## 2021-09-30 LAB — POC URINE PREG, ED: Preg Test, Ur: NEGATIVE

## 2021-09-30 MED ORDER — HYDROCODONE-ACETAMINOPHEN 5-325 MG PO TABS: 1.0000 | ORAL_TABLET | Freq: Four times a day (QID) | ORAL | 0 refills | Status: AC | PRN

## 2021-09-30 NOTE — ED Triage Notes (Signed)
Pt reports vaginal bleeding and abd pain for about a month.  ?

## 2021-09-30 NOTE — ED Notes (Signed)
Pt to ED for cramping abdominal pain since 1 month. Pain is intermittent. Pain has been worse last few days.  ? ?States has had vaginal spotting for 1 month then started bleeding more heavily 4 days ago with quarter size and smaller clots. Before this, LMP was in November 2022. Denies known pregnancy. ? ?Denies vomiting and diarrhea, has been nauseous on and off. Nausea correlates with waves of abdominal pain. Denies urinary symptoms. ?

## 2021-09-30 NOTE — ED Provider Notes (Signed)
[facility] Provider Note  Patient Contact: 4:19 PM (approximate)   History   Abdominal Pain and Vaginal Bleeding   HPI  Kathleen Mccormick is a 26 y.o. female with a history of prior ovarian cysts, presents to the emergency department with irregular vaginal bleeding for the past month.  Patient states that she has had spotting intermittently for the past 4 weeks and states that she started heavy vaginal bleeding on Monday.  She has had associated cramps and is passing large blood clots.  She has had some nausea but no vomiting.  No chest pain, chest tightness or shortness of breath.      Physical Exam   Triage Vital Signs: ED Triage Vitals  Enc Vitals Group     BP 09/30/21 1559 112/68     Pulse Rate 09/30/21 1559 78     Resp 09/30/21 1559 17     Temp 09/30/21 1559 98 F (36.7 C)     Temp Source 09/30/21 1559 Oral     SpO2 09/30/21 1559 97 %     Weight 09/30/21 1556 143 lb 4.8 oz (65 kg)     Height 09/30/21 1556 4\' 11"  (1.499 m)     Head Circumference --      Peak Flow --      Pain Score 09/30/21 1556 6     Pain Loc --      Pain Edu? --      Excl. in GC? --     Most recent vital signs: Vitals:   09/30/21 1736 09/30/21 2109  BP: (!) 111/59 123/66  Pulse: 65 70  Resp: 14 18  Temp: 98 F (36.7 C) 98.5 F (36.9 C)  SpO2: 96% 98%     General: Alert and in no acute distress. Eyes:  PERRL. EOMI. Head: No acute traumatic findings ENT:      Ears:       Nose: No congestion/rhinnorhea.      Mouth/Throat: Mucous membranes are moist. Neck: No stridor. No cervical spine tenderness to palpation. Cardiovascular:  Good peripheral perfusion Respiratory: Normal respiratory effort without tachypnea or retractions. Lungs CTAB. Good air entry to the bases with no decreased or absent breath sounds. Gastrointestinal: Bowel sounds 4 quadrants. Soft and nontender to palpation. No guarding or rigidity. No palpable masses. No distention. No CVA  tenderness. Musculoskeletal: Full range of motion to all extremities.  Neurologic:  No gross focal neurologic deficits are appreciated.  Skin:   No rash noted Other:   ED Results / Procedures / Treatments   Labs (all labs ordered are listed, but only abnormal results are displayed) Labs Reviewed  CBC WITH DIFFERENTIAL/PLATELET - Abnormal; Notable for the following components:      Result Value   WBC 14.5 (*)    Platelets 406 (*)    Neutro Abs 12.1 (*)    All other components within normal limits  COMPREHENSIVE METABOLIC PANEL - Abnormal; Notable for the following components:   Total Bilirubin 1.3 (*)    All other components within normal limits  URINALYSIS, COMPLETE (UACMP) WITH MICROSCOPIC - Abnormal; Notable for the following components:   Color, Urine YELLOW (*)    APPearance HAZY (*)    Hgb urine dipstick LARGE (*)    RBC / HPF >50 (*)    All other components within normal limits  LIPASE, BLOOD  POC URINE PREG, ED       RADIOLOGY  I personally viewed and evaluated these images as part  of my medical decision making, as well as reviewing the written report by the radiologist.  ED Provider Interpretation: I personally reviewed pelvic ultrasound and there is a 4 mm cystic structure within the fundus of the uterus.   PROCEDURES:  Critical Care performed: No  Procedures   MEDICATIONS ORDERED IN ED: Medications - No data to display   IMPRESSION / MDM / ASSESSMENT AND PLAN / ED COURSE  I reviewed the triage vital signs and the nursing notes.                              Differential diagnosis includes, but is not limited to, hemorrhagic ovarian cyst, ovarian torsion, regular vaginal bleeding  Assessment and plan Vaginal bleeding 26 year old female presents to the emergency department with irregular vaginal bleeding for the past month.  Vital signs are reassuring at triage.  On physical exam, patient was alert, active and nontoxic-appearing patient had  elevated white blood cell count but upon reviewing past blood work, seems that patient chronically has elevated white blood cell count.  CMP reassuring.  Urinalysis indicated a large amount of hemoglobin but no other findings suggestive of UTI.  Urine pregnancy test was negative.  Dedicated pelvic ultrasound did show a 4 mm cystic structure within the endometrium at the fundus of the uterus.  Radiologist recommended short-term follow-up.  Patient was referred to gynecology and advised to make an appointment.  Clinical Course as of 09/30/21 2113  Fri Sep 30, 2021  1737 NEUT#(!): 12.1 [JW]    Clinical Course User Index [JW] Orvil Feil, [place]     FINAL CLINICAL IMPRESSION(S) / ED DIAGNOSES   Final diagnoses:  Pelvic pain  Vaginal bleeding     Rx / DC Orders   ED Discharge Orders          Ordered    HYDROcodone-acetaminophen (NORCO) 5-325 MG tablet  Every 6 hours PRN        09/30/21 2113             Note:  This document was prepared using Dragon voice recognition software and may include unintentional dictation errors.   Pia Mau [place], PA-C 09/30/21 2113    Merwyn Katos, MD 10/04/21 0700

## 2021-09-30 NOTE — Discharge Instructions (Addendum)
Please make follow-up appointment with Dr. Logan Bores. ?

## 2022-04-09 ENCOUNTER — Ambulatory Visit
Admission: EM | Admit: 2022-04-09 | Discharge: 2022-04-09 | Disposition: A | Discharge status: Home / Self Care | Payer: Medicaid Other | Attending: Urgent Care | Admitting: Urgent Care

## 2022-04-09 DIAGNOSIS — J029 Acute pharyngitis, unspecified: Secondary | ICD-10-CM | POA: Diagnosis present

## 2022-04-09 DIAGNOSIS — J069 Acute upper respiratory infection, unspecified: Secondary | ICD-10-CM

## 2022-04-09 LAB — POCT RAPID STREP A (OFFICE): Rapid Strep A Screen: NEGATIVE

## 2022-04-09 MED ORDER — PREDNISONE 10 MG PO TABS: 20.0000 mg | ORAL_TABLET | Freq: Every day | ORAL | 0 refills | Status: AC

## 2022-04-09 NOTE — Discharge Instructions (Addendum)
Gargle with salt water at least 3 times a day.  Use ibuprofen 600 mg 4 times a day as needed for relief of pain.  Follow-up here or with your primary care provider if your symptoms worsen or do not improve within 1 week.

## 2022-04-09 NOTE — ED Triage Notes (Signed)
Pt. States she was recently exposed to Covid. Pt. Is c/o of a sore throat. Pt.has tx herself w/ OTC medications with no relief.

## 2022-04-09 NOTE — ED Provider Notes (Signed)
Roderic Palau    CSN: [id number] Arrival date & time: 04/09/22  1006      History   Chief Complaint Chief Complaint  Patient presents with   Sore Throat   Nasal Congestion    HPI Kathleen Mccormick is a 26 y.o. female.    Sore Throat    Presents to urgent care with complaint of acutely sore throat since Wednesday (4 days).  He is using Tylenol to treat her symptoms without much relief.  Patient states she was recently exposed to [place] and unsure if she has a positive diagnosis.  Past Medical History:  Diagnosis Date   Medical history non-contributory     There are no problems to display for this patient.   Past Surgical History:  Procedure Laterality Date   TONSILLECTOMY      OB History     Gravida  1   Para      Term      Preterm      AB      Living  1      SAB      IAB      Ectopic      Multiple      Live Births  1            Home Medications    Prior to Admission medications   Medication Sig Start Date End Date Taking? Authorizing Provider  escitalopram (LEXAPRO) 5 MG tablet Take by mouth. 08/09/21   [provider]  magnesium oxide (MAG-OX) 400 (240 Mg) MG tablet Take 1 tablet by mouth daily. 05/31/21   [provider]  norgestimate-ethinyl estradiol ([place] 28) 0.25-35 MG-MCG tablet Take 1 tablet by mouth daily. 11/15/16 02/07/17  Will Bonnet, MD    Family History History reviewed. No pertinent family history.  Social History Social History   Tobacco Use   Smoking status: Former    Packs/day: 0.50    Types: Cigarettes    Quit date: 11/02/2015    Years since quitting: 6.4   Smokeless tobacco: Never  Substance Use Topics   Alcohol use: No   Drug use: No     Allergies   Patient has no known allergies.   Review of Systems Review of Systems   Physical Exam Triage Vital Signs ED Triage Vitals  Enc Vitals Group     BP 04/09/22 1108 116/81     Pulse Rate 04/09/22 1108 66      Resp 04/09/22 1108 16     Temp 04/09/22 1108 98.7 F (37.1 C)     Temp Source 04/09/22 1108 Oral     SpO2 04/09/22 1108 97 %     Weight --      Height --      Head Circumference --      Peak Flow --      Pain Score 04/09/22 1100 10     Pain Loc --      Pain Edu? --      Excl. in [place]? --    No data found.  Updated Vital Signs BP 116/81 (BP Location: Left Arm)   Pulse 66   Temp 98.7 F (37.1 C) (Oral)   Resp 16   LMP 02/25/2022 (Exact Date)   SpO2 97%   Visual Acuity Right Eye Distance:   Left Eye Distance:   Bilateral Distance:    Right Eye Near:   Left Eye Near:    Bilateral Near:  Physical Exam Vitals reviewed.  Constitutional:      Appearance: She is well-developed.  HENT:     Mouth/Throat:     Mouth: Mucous membranes are moist.     Pharynx: Posterior oropharyngeal erythema present. No oropharyngeal exudate.     Tonsils: No tonsillar exudate.  Skin:    General: Skin is warm and dry.  Neurological:     General: No focal deficit present.     Mental Status: She is alert and oriented to person, place, and time.  Psychiatric:        Mood and Affect: Mood normal.        Behavior: Behavior normal.      UC Treatments / Results  Labs (all labs ordered are listed, but only abnormal results are displayed) Labs Reviewed - No data to display  EKG   Radiology No results found.  Procedures Procedures (including critical care time)  Medications Ordered in UC Medications - No data to display  Initial Impression / Assessment and Plan / UC Course  I have reviewed the triage vital signs and the nursing notes.  Pertinent labs & imaging results that were available during my care of the patient were reviewed by me and considered in my medical decision making (see chart for details).   Rapid strepnegative.  Confirmatory culture sent to lab.  Suspect viral etiology.  Treating exquisitely painful throat with 2-day course of prednisone.  Recommending NSAIDs for  symptom relief.   Final Clinical Impressions(s) / UC Diagnoses   Final diagnoses:  None   Discharge Instructions   None    ED Prescriptions   None    PDMP not reviewed this encounter.   Charma Igo, [place] 04/09/22 1149

## 2022-04-12 LAB — CULTURE, GROUP A STREP (THRC)

## 2022-09-04 DIAGNOSIS — N939 Abnormal uterine and vaginal bleeding, unspecified: Secondary | ICD-10-CM | POA: Insufficient documentation

## 2023-07-05 DIAGNOSIS — N921 Excessive and frequent menstruation with irregular cycle: Secondary | ICD-10-CM | POA: Insufficient documentation

## 2023-07-05 DIAGNOSIS — N884 Hypertrophic elongation of cervix uteri: Secondary | ICD-10-CM | POA: Insufficient documentation

## 2023-09-20 ENCOUNTER — Emergency Department
Admission: EM | Admit: 2023-09-20 | Discharge: 2023-09-20 | Discharge status: Against Medical Advice | Payer: Medicaid Other | Attending: Emergency Medicine | Admitting: Emergency Medicine

## 2023-09-20 ENCOUNTER — Other Ambulatory Visit: Payer: Self-pay

## 2023-09-20 ENCOUNTER — Encounter: Payer: Self-pay | Admitting: Emergency Medicine

## 2023-09-20 DIAGNOSIS — R110 Nausea: Secondary | ICD-10-CM | POA: Insufficient documentation

## 2023-09-20 DIAGNOSIS — Z5321 Procedure and treatment not carried out due to patient leaving prior to being seen by health care provider: Secondary | ICD-10-CM | POA: Insufficient documentation

## 2023-09-20 DIAGNOSIS — R1030 Lower abdominal pain, unspecified: Secondary | ICD-10-CM | POA: Diagnosis present

## 2023-09-20 LAB — CBC WITH DIFFERENTIAL/PLATELET
Abs Immature Granulocytes: 0.04 10*3/uL (ref 0.00–0.07)
Basophils Absolute: 0.1 10*3/uL (ref 0.0–0.1)
Basophils Relative: 1 %
Eosinophils Absolute: 0.2 10*3/uL (ref 0.0–0.5)
Eosinophils Relative: 2 %
HCT: 38.5 % (ref 36.0–46.0)
Hemoglobin: 12.8 g/dL (ref 12.0–15.0)
Immature Granulocytes: 0 %
Lymphocytes Relative: 25 %
Lymphs Abs: 2.7 10*3/uL (ref 0.7–4.0)
MCH: 28.9 pg (ref 26.0–34.0)
MCHC: 33.2 g/dL (ref 30.0–36.0)
MCV: 86.9 fL (ref 80.0–100.0)
Monocytes Absolute: 1.1 10*3/uL — ABNORMAL HIGH (ref 0.1–1.0)
Monocytes Relative: 10 %
Neutro Abs: 6.9 10*3/uL (ref 1.7–7.7)
Neutrophils Relative %: 62 %
Platelets: 377 10*3/uL (ref 150–400)
RBC: 4.43 MIL/uL (ref 3.87–5.11)
RDW: 13.6 % (ref 11.5–15.5)
WBC: 10.9 10*3/uL — ABNORMAL HIGH (ref 4.0–10.5)
nRBC: 0 % (ref 0.0–0.2)

## 2023-09-20 LAB — COMPREHENSIVE METABOLIC PANEL
ALT: 17 U/L (ref 0–44)
AST: 16 U/L (ref 15–41)
Albumin: 3.9 g/dL (ref 3.5–5.0)
Alkaline Phosphatase: 54 U/L (ref 38–126)
Anion gap: 9 (ref 5–15)
BUN: 13 mg/dL (ref 6–20)
CO2: 20 mmol/L — ABNORMAL LOW (ref 22–32)
Calcium: 8.5 mg/dL — ABNORMAL LOW (ref 8.9–10.3)
Chloride: 103 mmol/L (ref 98–111)
Creatinine, Ser: 0.66 mg/dL (ref 0.44–1.00)
GFR, Estimated: >60 mL/min (ref >60)
Glucose, Bld: 110 mg/dL — ABNORMAL HIGH (ref 70–99)
Potassium: 3.9 mmol/L (ref 3.5–5.1)
Sodium: 132 mmol/L — ABNORMAL LOW (ref 135–145)
Total Bilirubin: 0.3 mg/dL (ref 0.0–1.2)
Total Protein: 7 g/dL (ref 6.5–8.1)

## 2023-09-20 LAB — URINALYSIS, ROUTINE W REFLEX MICROSCOPIC
Bilirubin Urine: NEGATIVE
Glucose, UA: NEGATIVE mg/dL
Hgb urine dipstick: NEGATIVE
Ketones, ur: NEGATIVE mg/dL
Leukocytes,Ua: NEGATIVE
Nitrite: NEGATIVE
Protein, ur: NEGATIVE mg/dL
Specific Gravity, Urine: 1.024 (ref 1.005–1.030)
pH: 6 (ref 5.0–8.0)

## 2023-09-20 LAB — POC URINE PREG, ED: Preg Test, Ur: NEGATIVE

## 2023-09-20 LAB — LIPASE, BLOOD: Lipase: 23 U/L (ref 11–51)

## 2023-09-20 NOTE — ED Triage Notes (Signed)
 Patient ambulatory to triage with steady gait, without difficulty or distress noted; pt reports lower abd/pelvic pain accomp by nausea x 3 days

## 2023-12-03 ENCOUNTER — Other Ambulatory Visit
Admission: RE | Admit: 2023-12-03 | Discharge: 2023-12-03 | Disposition: A | Discharge status: Home / Self Care | Source: Ambulatory Visit | Attending: Obstetrics and Gynecology | Admitting: Obstetrics and Gynecology

## 2023-12-03 DIAGNOSIS — Z3201 Encounter for pregnancy test, result positive: Secondary | ICD-10-CM | POA: Insufficient documentation

## 2023-12-03 LAB — HCG, QUANTITATIVE, PREGNANCY: hCG, Beta Chain, Quant, S: 49 m[IU]/mL — ABNORMAL HIGH (ref <5)

## 2023-12-10 ENCOUNTER — Other Ambulatory Visit
Admission: RE | Admit: 2023-12-10 | Discharge: 2023-12-10 | Disposition: A | Discharge status: Home / Self Care | Source: Ambulatory Visit | Attending: Obstetrics and Gynecology | Admitting: Obstetrics and Gynecology

## 2023-12-10 DIAGNOSIS — O4690 Antepartum hemorrhage, unspecified, unspecified trimester: Secondary | ICD-10-CM | POA: Insufficient documentation

## 2023-12-10 LAB — HCG, QUANTITATIVE, PREGNANCY: hCG, Beta Chain, Quant, S: 35 m[IU]/mL — ABNORMAL HIGH (ref <5)

## 2024-01-17 IMAGING — US US PELVIS COMPLETE TRANSABD/TRANSVAG W DUPLEX AND/OR DOPPLER
1 of 2 series · 12 of 25 positions shown · non-contrast
Comparison: Previous pelvic sonogram done on 08/31/2021 and CT done
on 08/25/2021
COMPARISON: Previous pelvic sonogram done on 08/31/2021 and CT done
on 08/25/2021

Addendum:
CLINICAL DATA: Pelvic pain

EXAM:
TRANSABDOMINAL AND TRANSVAGINAL ULTRASOUND OF PELVIS
DOPPLER ULTRASOUND OF OVARIES
TECHNIQUE: Both transabdominal and transvaginal ultrasound examinations of the
pelvis were performed. Transabdominal technique was performed for
global imaging of the pelvis including uterus, ovaries, adnexal
regions, and pelvic cul-de-sac.
It was necessary to proceed with endovaginal exam following the
transabdominal exam to visualize the endometrium and ovaries. Color
and duplex Doppler ultrasound was utilized to evaluate blood flow to
the ovaries.

[Series 1: us pelvic complete w transvaginal and torsion righ · 12 of 76 slices shown]
[im 4/76]
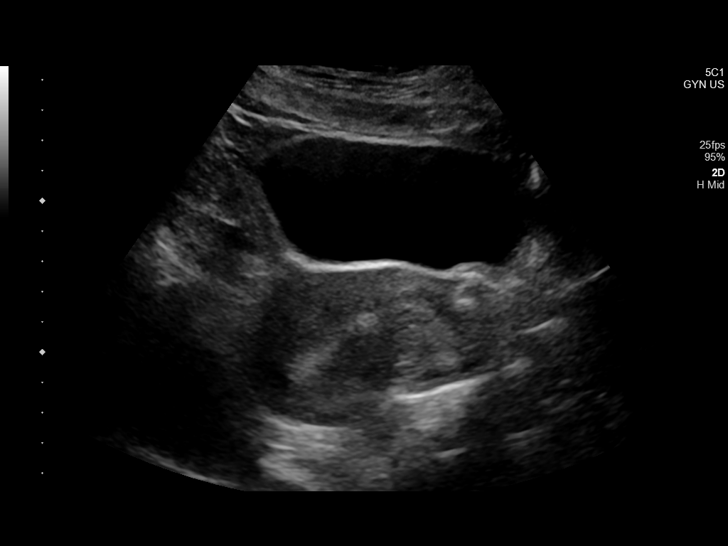
[im 10/76]
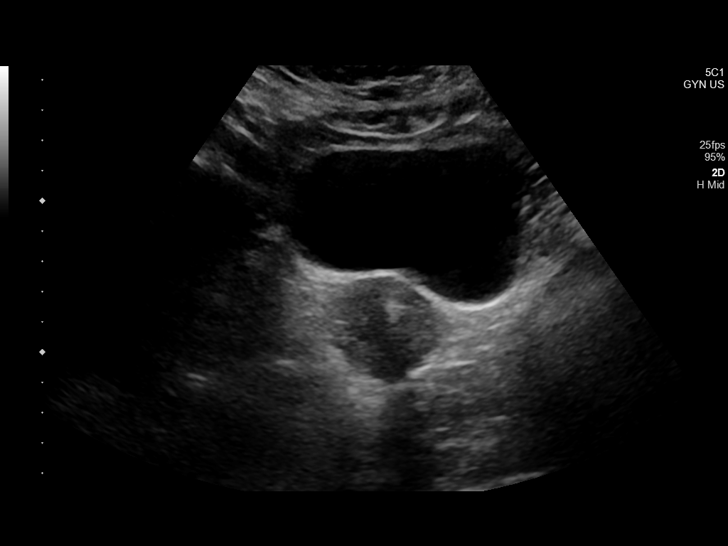
[im 17/76]
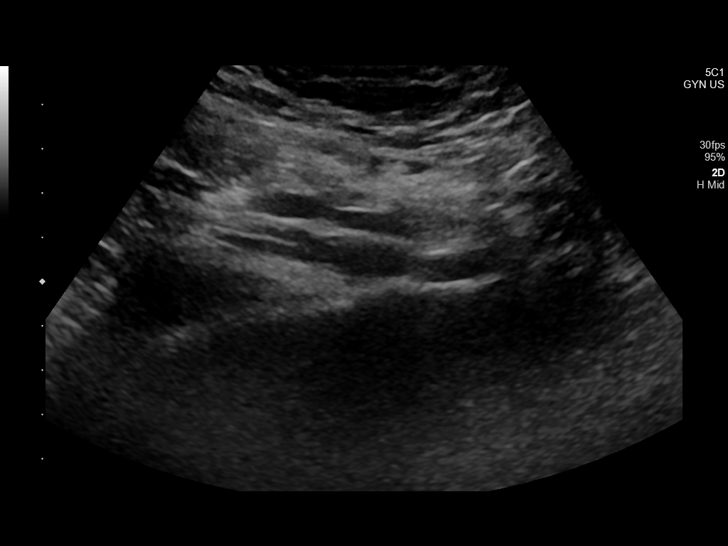
[im 23/76]
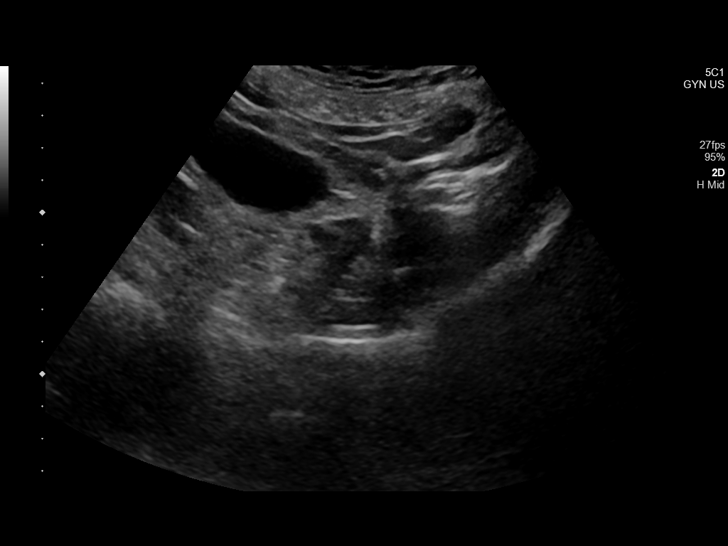
[im 30/76]
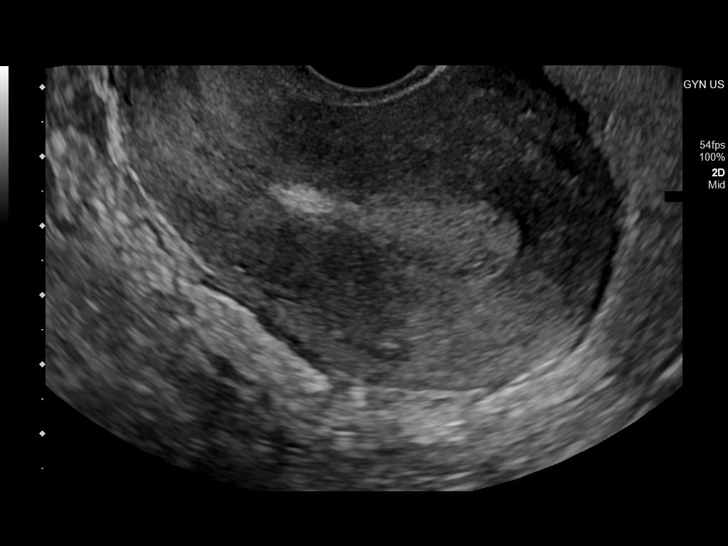
[im 36/76]
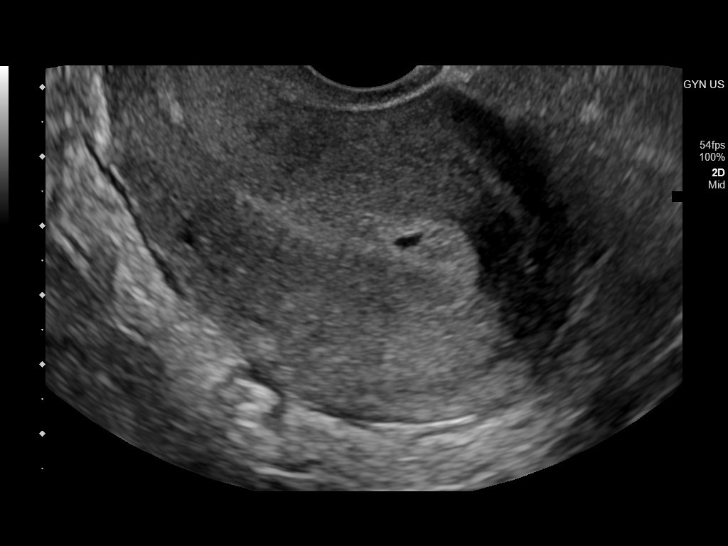
[im 43/76]
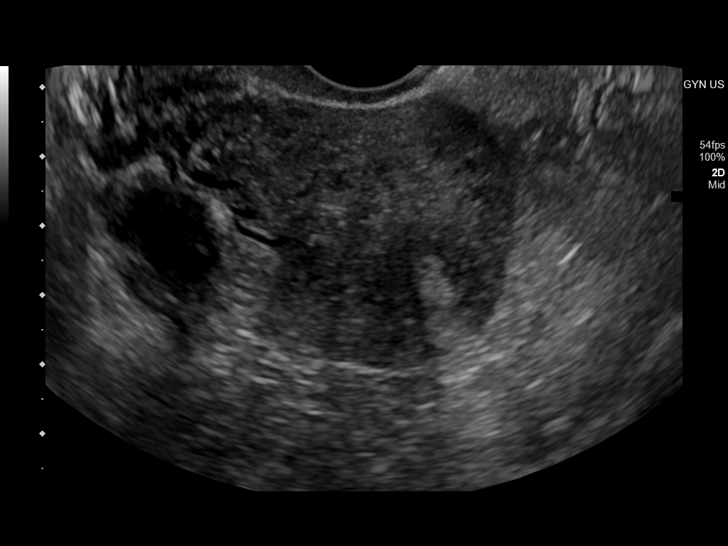
[im 49/76]
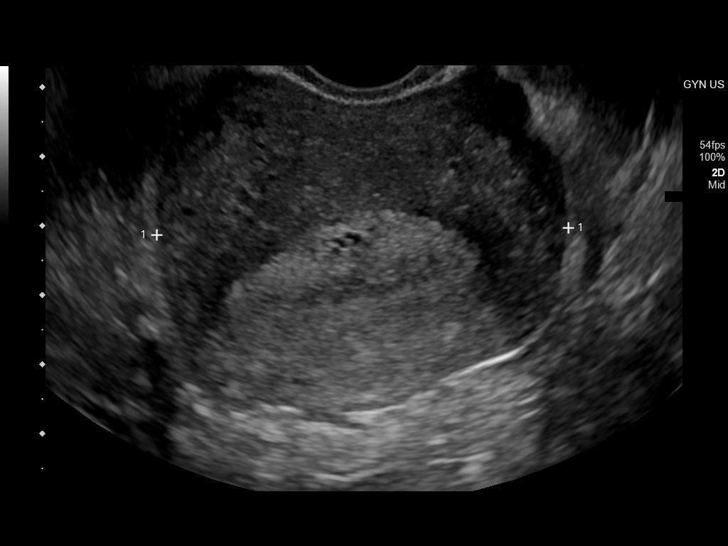
[im 56/76]
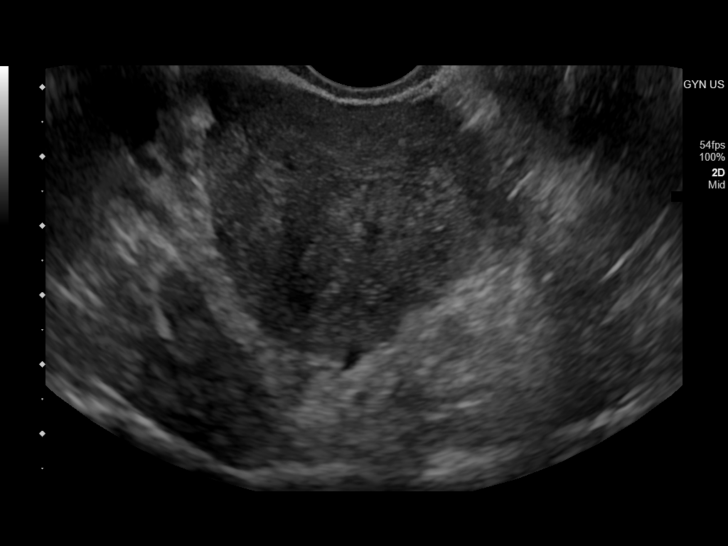
[im 62/76]
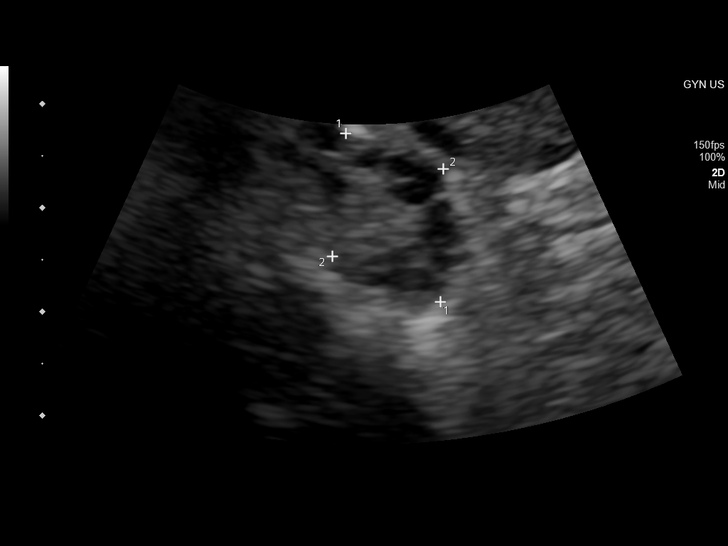
[im 69/76]
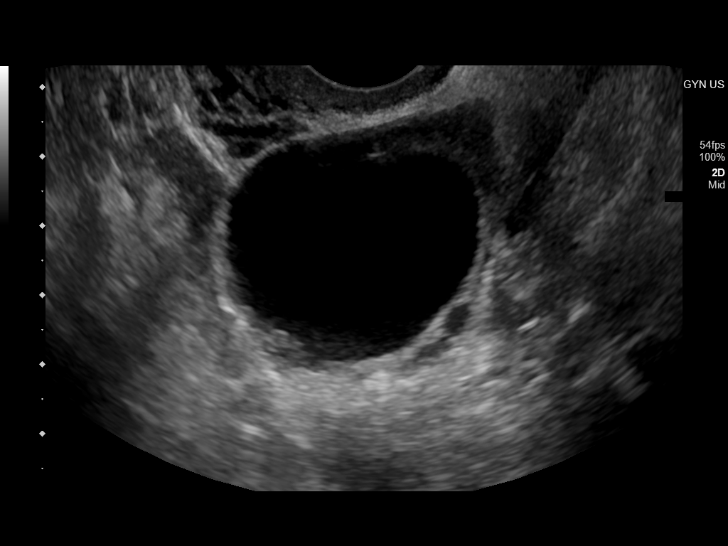
[im 76/76]
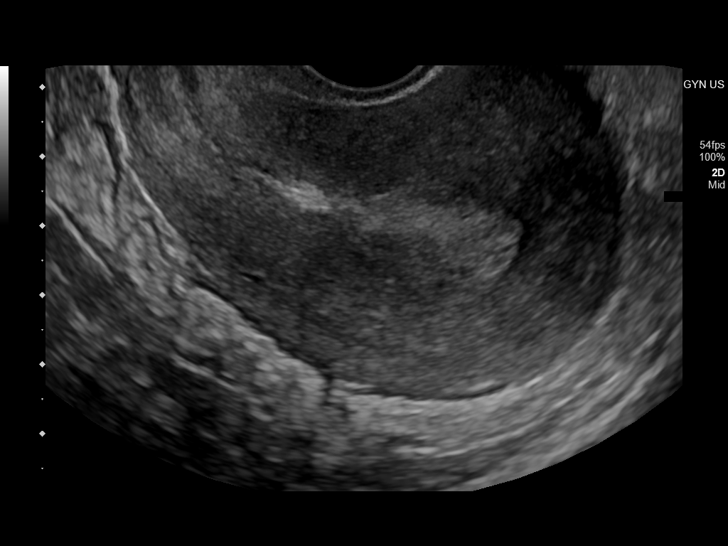

[12 of 25 positions shown; findings below may reference images not displayed]

FINDINGS: Uterus

Measurements: 7.7 x 4.9 x 5.9 cm = volume: 116.09 mL. No fibroids or
other mass visualized. Uterus is retroverted.

Endometrium

Thickness: 15 mm. There is inhomogeneous echogenicity in the
endometrium. There is 4 mm anechoic structure, possibly a small
focus of fluid collection in the endometrium within the fundus.
There is no significant abnormal increase in vascularity in the
endometrium.

Right ovary

Measurements: 1.9 x 1.4 x 1.3 cm = volume: 1.7 cm mL. Normal
appearance/no adnexal mass.

Left ovary

Measurements: 4.8 x 3.4 x 3.2 cm = volume: 27.7 mL. There is 3.7 x
3.3 by 3.2 cm cyst in the left adnexa. There are no internal
septations or mural nodules.

Pulsed Doppler evaluation of both ovaries demonstrates normal
low-resistance arterial and venous waveforms.

Other findings

There is trace amount of free fluid.
IMPRESSION: Endometrial stripe is thickened measuring up to 15 mm. There is
small 4 mm cystic focus in the endometrium within the fundus.
Short-term follow-up pelvic sonogram may be considered. There is
cm simple appearing cyst in the left adnexa, possibly functional
left ovarian cyst.

ADDENDUM:
This addendum is made to clarify Doppler technique used for the
study. Color flow Doppler and pulsed Doppler examinations were
performed.

*** End of Addendum ***
FINDINGS: Uterus

Measurements: 7.7 x 4.9 x 5.9 cm = volume: 116.09 mL. No fibroids or
other mass visualized. Uterus is retroverted.

Endometrium

Thickness: 15 mm. There is inhomogeneous echogenicity in the
endometrium. There is 4 mm anechoic structure, possibly a small
focus of fluid collection in the endometrium within the fundus.
There is no significant abnormal increase in vascularity in the
endometrium.

Right ovary

Measurements: 1.9 x 1.4 x 1.3 cm = volume: 1.7 cm mL. Normal
appearance/no adnexal mass.

Left ovary

Measurements: 4.8 x 3.4 x 3.2 cm = volume: 27.7 mL. There is 3.7 x
3.3 by 3.2 cm cyst in the left adnexa. There are no internal
septations or mural nodules.

Pulsed Doppler evaluation of both ovaries demonstrates normal
low-resistance arterial and venous waveforms.

Other findings

There is trace amount of free fluid.
IMPRESSION: Endometrial stripe is thickened measuring up to 15 mm. There is
small 4 mm cystic focus in the endometrium within the fundus.
Short-term follow-up pelvic sonogram may be considered. There is
cm simple appearing cyst in the left adnexa, possibly functional
left ovarian cyst.

## 2024-03-14 DIAGNOSIS — O0990 Supervision of high risk pregnancy, unspecified, unspecified trimester: Secondary | ICD-10-CM | POA: Insufficient documentation

## 2024-03-14 DIAGNOSIS — O99210 Obesity complicating pregnancy, unspecified trimester: Secondary | ICD-10-CM | POA: Insufficient documentation

## 2024-04-02 ENCOUNTER — Telehealth: Payer: Self-pay | Admitting: Licensed Clinical Social Worker

## 2024-04-02 NOTE — Telephone Encounter (Signed)
 Referral from Therisa Pillow, [facility]. Anxiety.

## 2024-05-05 NOTE — Progress Notes (Signed)
  [facility] - OBSTETRICS AND GYNECOLOGY   Routine Prenatal Care Visit  Subjective  Kathleen Mccormick is a 28 y.o. G3P1011 at [redacted]w[redacted]d being seen today for ongoing prenatal care.  She is currently monitored for tthis high-risk pregnancy.  ----------------------------------------------------------------------------------- Patient reports not sleeping well and some headaches..    .  .  Movement: Absent (occasionally). Leaking Fluid denies.  Vaginal Bleeding: denies Nausea is much better, but occasionally nauseated.  ----------------------------------------------------------------------------------- The following portions of the patient's history were reviewed and updated as appropriate: allergies, current medications, past family history, past medical history, past social history, past surgical history and problem list. Problem list updated.  Objective  BP 103/69   Pulse 80   Ht 152.4 cm (5')   Wt 74.4 kg (164 lb)   LMP 10/31/2023   BMI 32.03 kg/m   Pregravid weight 73 kg (161 lb) Total Weight Gain 1.361 kg (3 lb) Urinalysis: Urine Protein    Urine Glucose    Fetal Status: Fetal Heart Rate: 145   Movement: Absent (occasionally)     General:  Alert, oriented and cooperative. Patient is in no acute distress.  Skin: Skin is warm and dry. No rash noted.   Cardiovascular: Normal heart rate noted  Respiratory: Normal respiratory effort, no problems with respiration noted  Abdomen: Soft, gravid, appropriate for gestational age.       Pelvic:  Cervical exam deferred        Extremities: Normal range of motion.     Mental Status: Normal mood and affect. Normal behavior. Normal judgment and thought content.   Assessment   28 y.o. G3P1011 at [redacted]w[redacted]d by  10/16/2024, Date entered prior to episode creation presenting for routine prenatal visit  Plan   Preterm labor symptoms and general obstetric precautions including but not limited to vaginal bleeding, contractions, leaking of fluid and  fetal movement were reviewed in detail with the patient. Please refer to After Visit Summary for other counseling recommendations.   - Declines msAFP  Return in about 4 weeks (around 06/02/2024) for Anatomy ultrsaound and routine prenatal visit.   Attestation Statement:   I personally performed the service. (TP)  STEPHEN TORIBIO MACE, MD  [facility] 05/05/2024 2:29 PM

## 2024-07-23 ENCOUNTER — Ambulatory Visit
Admission: EM | Admit: 2024-07-23 | Discharge: 2024-07-23 | Disposition: A | Discharge status: Home / Self Care | Attending: Family Medicine | Admitting: Family Medicine

## 2024-07-23 DIAGNOSIS — B349 Viral infection, unspecified: Secondary | ICD-10-CM | POA: Diagnosis not present

## 2024-07-23 DIAGNOSIS — J029 Acute pharyngitis, unspecified: Secondary | ICD-10-CM | POA: Diagnosis not present

## 2024-07-23 LAB — POCT RAPID STREP A (OFFICE): Rapid Strep A Screen: NEGATIVE

## 2024-07-23 NOTE — ED Provider Notes (Signed)
 " [facility]    CSN: [id number] Arrival date & time: 07/23/24  0913      History   Chief Complaint Chief Complaint  Patient presents with   Sore Throat   Fatigue   Headache    HPI Kathleen Mccormick is a 28 y.o. female  presents for evaluation of URI symptoms for 6 days. Patient reports associated symptoms of sore throat, cough, congestion, fatigue. Denies N/V/D, fevers, ear pain, body aches, shortness of breath. Patient does not have a hx of asthma. Patient is not an active smoker.   Reports sick contacts via family.  Pt has taken Tylenol  and Mucinex OTC for symptoms.  Patient is [redacted] weeks pregnant.  Reports she is staying hydrated.  Denies vaginal discharge bleeding. pt has no other concerns at this time.    Sore Throat Associated symptoms include headaches.  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 Authorizing Provider  escitalopram (LEXAPRO) 5 MG tablet Take by mouth. 08/09/21   [provider]  Ferrous Sulfate  (IRON PO) Take 1 tablet by mouth daily.    [provider]  magnesium oxide (MAG-OX) 400 (240 Mg)  MG tablet Take 1 tablet by mouth daily. 05/31/21   [provider]  norgestimate -ethinyl estradiol  (SPRINTEC 28) 0.25-35 MG-MCG tablet Take 1 tablet by mouth daily. 11/15/16 02/07/17  Leonce Garnette BIRCH, MD    Family History History reviewed. No pertinent family history.  Social History Social History[1]   Allergies   Patient has no known allergies.   Review of Systems Review of Systems  Constitutional:  Positive for fatigue.  HENT:  Positive for congestion and sore throat.   Respiratory:  Positive for cough.   Neurological:  Positive for headaches.     Physical Exam Triage Vital Signs ED Triage Vitals  Encounter Vitals Group     BP 07/23/24 1029 (!) 95/56     Girls Systolic BP Percentile --      Girls Diastolic BP Percentile --      Boys Systolic BP Percentile --      Boys Diastolic BP Percentile --      Pulse Rate 07/23/24 1029 97     Resp 07/23/24 1029 18     Temp 07/23/24 1029 98.6 F (37 C)     Temp Source 07/23/24 1029 Oral     SpO2  07/23/24 1029 98 %     Weight 07/23/24 1027 172 lb 9.6 oz (78.3 kg)     Height --      Head Circumference --      Peak Flow --      Pain Score 07/23/24 1028 10     Pain Loc --      Pain Education --      Exclude from Growth Chart --    No data found.  Updated Vital Signs BP 116/76 (BP Location: Right Arm)   Pulse 97   Temp 98.6 F (37 C) (Oral)   Resp 18   Wt 172 lb 9.6 oz (78.3 kg)   LMP 09/02/2023 (Exact Date)   SpO2 98%   BMI 34.86 kg/m   Visual Acuity Right Eye Distance:   Left Eye Distance:   Bilateral Distance:    Right Eye Near:   Left Eye Near:    Bilateral Near:     Physical Exam Vitals and nursing note reviewed.  Constitutional:      General: She is not in acute distress.    Appearance: She is well-developed. She is not ill-appearing.  HENT:     Head: Normocephalic and atraumatic.     Right Ear: Tympanic membrane and ear canal normal.     Left Ear: Tympanic membrane and ear canal normal.      Nose: Congestion present.     Mouth/Throat:     Mouth: Mucous membranes are moist.     Pharynx: Oropharynx is clear. Uvula midline. Posterior oropharyngeal erythema and postnasal drip present. No pharyngeal swelling, oropharyngeal exudate or uvula swelling.     Tonsils: No tonsillar exudate or tonsillar abscesses.  Eyes:     Conjunctiva/sclera: Conjunctivae normal.     Pupils: Pupils are equal, round, and reactive to light.  Cardiovascular:     Rate and Rhythm: Normal rate and regular rhythm.     Heart sounds: Normal heart sounds.  Pulmonary:     Effort: Pulmonary effort is normal.     Breath sounds: Normal breath sounds. No wheezing, rhonchi or rales.  Musculoskeletal:     Cervical back: Normal range of motion and neck supple.  Lymphadenopathy:     Cervical: No cervical adenopathy.  Skin:    General: Skin is warm and dry.  Neurological:     General: No focal deficit present.     Mental Status: She is alert and oriented to person, place, and time.  Psychiatric:        Mood and Affect: Mood normal.        Behavior: Behavior normal.      UC Treatments / Results  Labs (all labs ordered are listed, but only abnormal results are displayed) Labs Reviewed  POCT RAPID STREP A (OFFICE) - Normal  CULTURE, GROUP A STREP [facility])    EKG   Radiology No results found.  Procedures Procedures (including critical care time)  Medications Ordered in UC Medications - No data to display  Initial Impression / Assessment and Plan / UC Course  I have reviewed the triage vital signs and the nursing notes.  Pertinent labs & imaging results that were available during my care of the patient were reviewed by me and considered in my medical decision making (see chart for details).     Reviewed exam and symptoms with patient.  No red flags.  BP low on intake, returned to baseline on recheck.  Negative rapid strep, will send strep throat culture.  Discussed  viral illness and symptomatic  treatment.  May continue OTC Tylenol , rest, fluids, salt water gargles and warm liquids such as teas.  Advised OB follow-up at her scheduled appointment next week.  ER precautions reviewed and patient verbalized understanding. Final Clinical Impressions(s) / UC Diagnoses   Final diagnoses:  Sore throat  Viral illness     Discharge Instructions      You tested negative for strep throat.  A clinical contact you with results of the strep throat culture done today positive.  Please treat your symptoms with over the counter tylenol , humidifier, and rest. Viral illnesses can last 7-10 days. Please follow up with your OB at your scheduled for next week. Please go to the ER for any worsening symptoms. This includes but is not limited to fever you can not control with tylenol ,  you are not able to stay hydrated, you have shortness of breath or chest pain.  Thank you for choosing [facility] for your healthcare needs. I hope you feel better soon!      ED Prescriptions   None    PDMP not reviewed this encounter.     [1]  Social History Tobacco Use   Smoking status: Former    Current packs/day: 0.00    Average packs/day: 0.5 packs/day    Types: Cigarettes    Quit date: 11/02/2015    Years since quitting: 8.7   Smokeless tobacco: Never  Substance Use Topics   Alcohol use: No   Drug use: No     Loreda Myla SAUNDERS, NP 07/23/24 1056  "

## 2024-07-23 NOTE — Discharge Instructions (Addendum)
 You tested negative for strep throat.  A clinical contact you with results of the strep throat culture done today positive.  Please treat your symptoms with over the counter tylenol , humidifier, and rest. Viral illnesses can last 7-10 days. Please follow up with your OB at your scheduled for next week. Please go to the ER for any worsening symptoms. This includes but is not limited to fever you can not control with tylenol ,  you are not able to stay hydrated, you have shortness of breath or chest pain.  Thank you for choosing [facility] for your healthcare needs. I hope you feel better soon!

## 2024-07-23 NOTE — ED Triage Notes (Signed)
 Pt c/o sore throat,fatigue,congestion & HA x6 days. Denies fevers. Currently 28 wks preg. Has tried OTC meds w/o relief.

## 2024-07-26 LAB — CULTURE, GROUP A STREP (THRC)

## 2024-07-28 ENCOUNTER — Ambulatory Visit (HOSPITAL_COMMUNITY): Payer: Self-pay
